# Patient Record
Sex: Female | Born: 1950 | Race: White | Hispanic: No | Marital: Married | Smoking: Never smoker
Health system: Southern US, Community
[De-identification: ages and names within clinical notes are randomized; demographics above are authoritative.]

## PROBLEM LIST (undated history)

## (undated) DIAGNOSIS — I5022 Chronic systolic (congestive) heart failure: Secondary | ICD-10-CM

## (undated) DIAGNOSIS — I420 Dilated cardiomyopathy: Secondary | ICD-10-CM

## (undated) DIAGNOSIS — K219 Gastro-esophageal reflux disease without esophagitis: Secondary | ICD-10-CM

## (undated) DIAGNOSIS — E1121 Type 2 diabetes mellitus with diabetic nephropathy: Secondary | ICD-10-CM

## (undated) DIAGNOSIS — K9589 Other complications of other bariatric procedure: Secondary | ICD-10-CM

## (undated) DIAGNOSIS — E119 Type 2 diabetes mellitus without complications: Secondary | ICD-10-CM

## (undated) DIAGNOSIS — I509 Heart failure, unspecified: Secondary | ICD-10-CM

## (undated) DIAGNOSIS — L718 Other rosacea: Secondary | ICD-10-CM

## (undated) DIAGNOSIS — K912 Postsurgical malabsorption, not elsewhere classified: Secondary | ICD-10-CM

## (undated) DIAGNOSIS — Z9884 Bariatric surgery status: Secondary | ICD-10-CM

## (undated) DIAGNOSIS — E785 Hyperlipidemia, unspecified: Secondary | ICD-10-CM

## (undated) DIAGNOSIS — I059 Rheumatic mitral valve disease, unspecified: Secondary | ICD-10-CM

## (undated) DIAGNOSIS — C801 Malignant (primary) neoplasm, unspecified: Secondary | ICD-10-CM

## (undated) DIAGNOSIS — I10 Essential (primary) hypertension: Secondary | ICD-10-CM

## (undated) DIAGNOSIS — D508 Other iron deficiency anemias: Secondary | ICD-10-CM

## (undated) DIAGNOSIS — D649 Anemia, unspecified: Secondary | ICD-10-CM

## (undated) HISTORY — PX: BREAST SURGERY: SHX581

## (undated) HISTORY — DX: Other rosacea: L71.8

## (undated) HISTORY — PX: TONSILLECTOMY: SUR1361

## (undated) HISTORY — DX: Gastro-esophageal reflux disease without esophagitis: K21.9

## (undated) HISTORY — DX: Heart failure, unspecified: I50.9

## (undated) HISTORY — DX: Rheumatic mitral valve disease, unspecified: I05.9

## (undated) HISTORY — PX: HERNIA REPAIR: SHX51

## (undated) HISTORY — DX: Type 2 diabetes mellitus without complications: E11.9

## (undated) HISTORY — PX: TUBAL LIGATION: SHX77

## (undated) HISTORY — PX: ROUX-EN-Y GASTRIC BYPASS: SHX1104

## (undated) HISTORY — PX: ABDOMINAL HYSTERECTOMY: SHX81

## (undated) HISTORY — DX: Essential (primary) hypertension: I10

## (undated) MED FILL — Iron Sucrose Inj 20 MG/ML (Fe Equiv): INTRAVENOUS | Qty: 10 | Status: AC

---

## 2010-06-01 ENCOUNTER — Ambulatory Visit: Payer: Self-pay | Admitting: Surgery

## 2010-06-08 ENCOUNTER — Ambulatory Visit: Payer: Self-pay | Admitting: Surgery

## 2010-06-10 LAB — PATHOLOGY REPORT

## 2010-06-23 ENCOUNTER — Ambulatory Visit: Payer: Self-pay | Admitting: Surgery

## 2010-06-25 LAB — PATHOLOGY REPORT

## 2010-07-06 ENCOUNTER — Ambulatory Visit: Payer: Self-pay | Admitting: Oncology

## 2010-07-19 ENCOUNTER — Ambulatory Visit: Payer: Self-pay | Admitting: Oncology

## 2010-08-19 ENCOUNTER — Ambulatory Visit: Payer: Self-pay | Admitting: Oncology

## 2010-09-17 ENCOUNTER — Ambulatory Visit: Payer: Self-pay | Admitting: Oncology

## 2010-10-18 ENCOUNTER — Ambulatory Visit: Payer: Self-pay | Admitting: Oncology

## 2010-11-17 ENCOUNTER — Ambulatory Visit: Payer: Self-pay | Admitting: Oncology

## 2010-12-18 ENCOUNTER — Ambulatory Visit: Payer: Self-pay | Admitting: Oncology

## 2011-01-17 ENCOUNTER — Ambulatory Visit: Payer: Self-pay | Admitting: Oncology

## 2011-02-18 ENCOUNTER — Ambulatory Visit: Payer: Self-pay | Admitting: Oncology

## 2011-02-19 LAB — CANCER ANTIGEN 27.29: CA 27.29: 24.1 U/mL (ref 0.0–38.6)

## 2011-03-20 ENCOUNTER — Ambulatory Visit: Payer: Self-pay | Admitting: Oncology

## 2011-04-19 ENCOUNTER — Ambulatory Visit: Payer: Self-pay | Admitting: Oncology

## 2011-05-20 ENCOUNTER — Ambulatory Visit: Payer: Self-pay | Admitting: Oncology

## 2011-07-29 ENCOUNTER — Ambulatory Visit: Payer: Self-pay | Admitting: Radiation Oncology

## 2011-08-20 ENCOUNTER — Ambulatory Visit: Payer: Self-pay | Admitting: Radiation Oncology

## 2011-09-09 LAB — CBC CANCER CENTER
Basophil #: 0.1 x10 3/mm (ref 0.0–0.1)
Eosinophil #: 0.5 x10 3/mm (ref 0.0–0.7)
Eosinophil %: 6.7 %
Lymphocyte #: 1.3 x10 3/mm (ref 1.0–3.6)
MCH: 28.3 pg (ref 26.0–34.0)
MCV: 84 fL (ref 80–100)
Monocyte #: 0.7 x10 3/mm (ref 0.0–0.7)
Neutrophil %: 65.2 %
Platelet: 290 x10 3/mm (ref 150–440)
RBC: 4.23 10*6/uL (ref 3.80–5.20)
RDW: 15.3 % — ABNORMAL HIGH (ref 11.5–14.5)

## 2011-09-09 LAB — COMPREHENSIVE METABOLIC PANEL
Alkaline Phosphatase: 154 U/L — ABNORMAL HIGH (ref 50–136)
Anion Gap: 7 (ref 7–16)
BUN: 15 mg/dL (ref 7–18)
Bilirubin,Total: 0.3 mg/dL (ref 0.2–1.0)
Co2: 29 mmol/L (ref 21–32)
Creatinine: 1.31 mg/dL — ABNORMAL HIGH (ref 0.60–1.30)
EGFR (Non-African Amer.): 44 — ABNORMAL LOW
Potassium: 4.2 mmol/L (ref 3.5–5.1)
SGOT(AST): 25 U/L (ref 15–37)
SGPT (ALT): 46 U/L
Total Protein: 7.8 g/dL (ref 6.4–8.2)

## 2011-09-17 ENCOUNTER — Ambulatory Visit: Payer: Self-pay | Admitting: Radiation Oncology

## 2012-05-11 ENCOUNTER — Ambulatory Visit: Payer: Self-pay | Admitting: Oncology

## 2012-05-11 LAB — CBC CANCER CENTER
Basophil #: 0.1 x10 3/mm (ref 0.0–0.1)
Basophil %: 1.2 %
Eosinophil #: 0.3 x10 3/mm (ref 0.0–0.7)
HCT: 37.1 % (ref 35.0–47.0)
HGB: 11.8 g/dL — ABNORMAL LOW (ref 12.0–16.0)
Lymphocyte #: 1.4 x10 3/mm (ref 1.0–3.6)
Lymphocyte %: 19 %
MCH: 27.2 pg (ref 26.0–34.0)
Monocyte %: 9.3 %
Neutrophil #: 4.8 x10 3/mm (ref 1.4–6.5)
RBC: 4.35 10*6/uL (ref 3.80–5.20)
RDW: 14.8 % — ABNORMAL HIGH (ref 11.5–14.5)
WBC: 7.3 x10 3/mm (ref 3.6–11.0)

## 2012-05-11 LAB — COMPREHENSIVE METABOLIC PANEL
Alkaline Phosphatase: 145 U/L — ABNORMAL HIGH (ref 50–136)
Anion Gap: 12 (ref 7–16)
Bilirubin,Total: 0.2 mg/dL (ref 0.2–1.0)
Calcium, Total: 9.4 mg/dL (ref 8.5–10.1)
Chloride: 101 mmol/L (ref 98–107)
Co2: 24 mmol/L (ref 21–32)
Creatinine: 1.23 mg/dL (ref 0.60–1.30)
EGFR (African American): 54 — ABNORMAL LOW
EGFR (Non-African Amer.): 47 — ABNORMAL LOW
SGOT(AST): 27 U/L (ref 15–37)
SGPT (ALT): 47 U/L (ref 12–78)
Sodium: 137 mmol/L (ref 136–145)

## 2012-05-12 LAB — CANCER ANTIGEN 27.29: CA 27.29: 24.7 U/mL (ref 0.0–38.6)

## 2012-05-19 ENCOUNTER — Ambulatory Visit: Payer: Self-pay | Admitting: Oncology

## 2012-07-16 IMAGING — US US OUTSIDE FILMS BREAST
1 series · 10 of 10 positions shown · non-contrast
Comparison: none

[Series 1: breast · 10 of 10 slices shown]
[im 1/10]
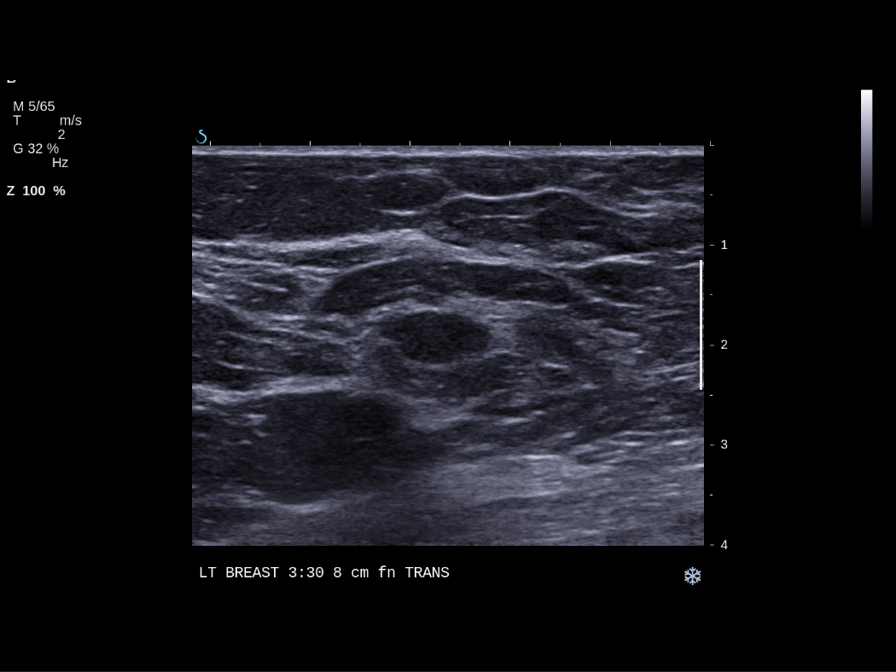
[im 2/10]
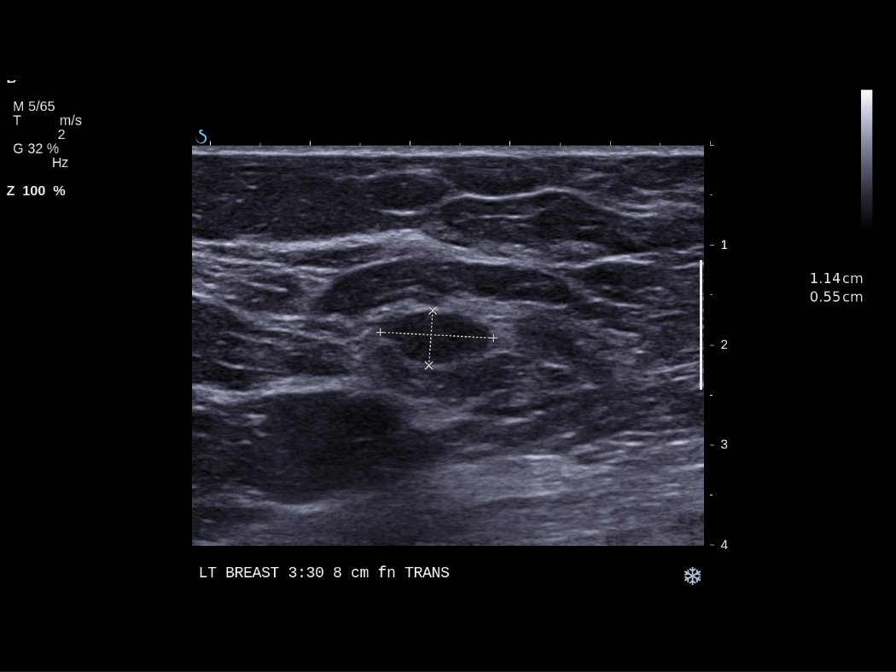
[im 3/10]
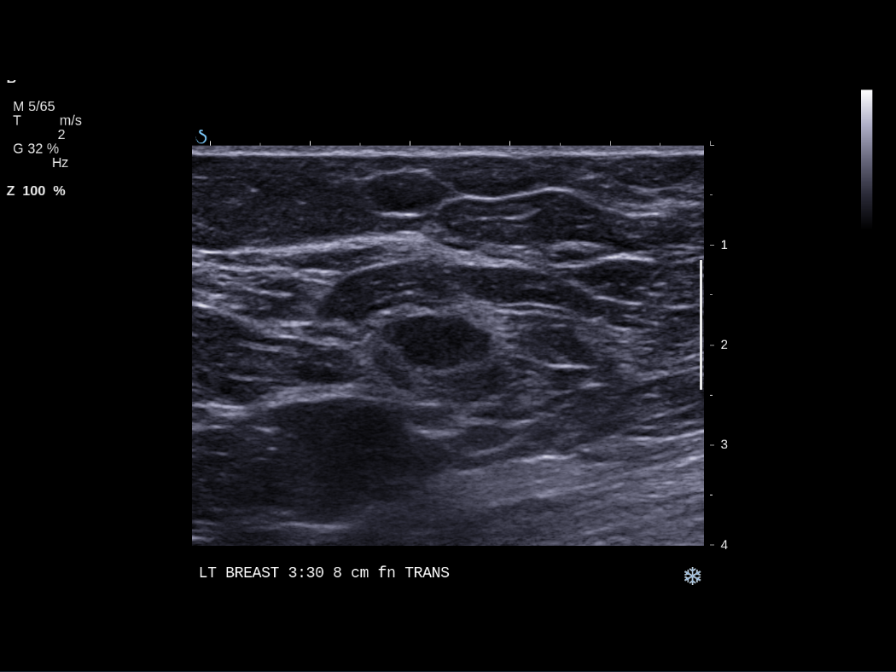
[im 4/10]
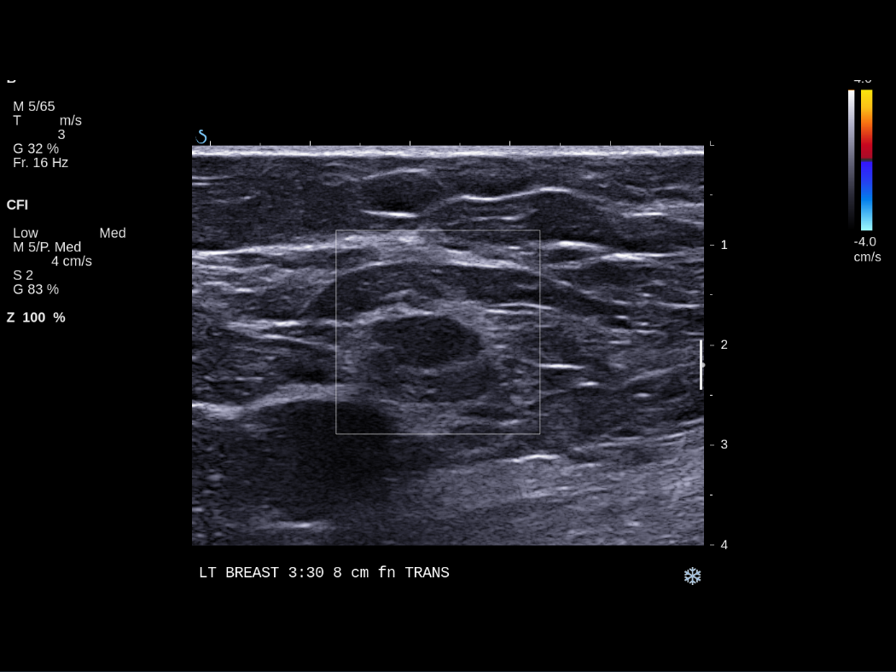
[im 5/10]
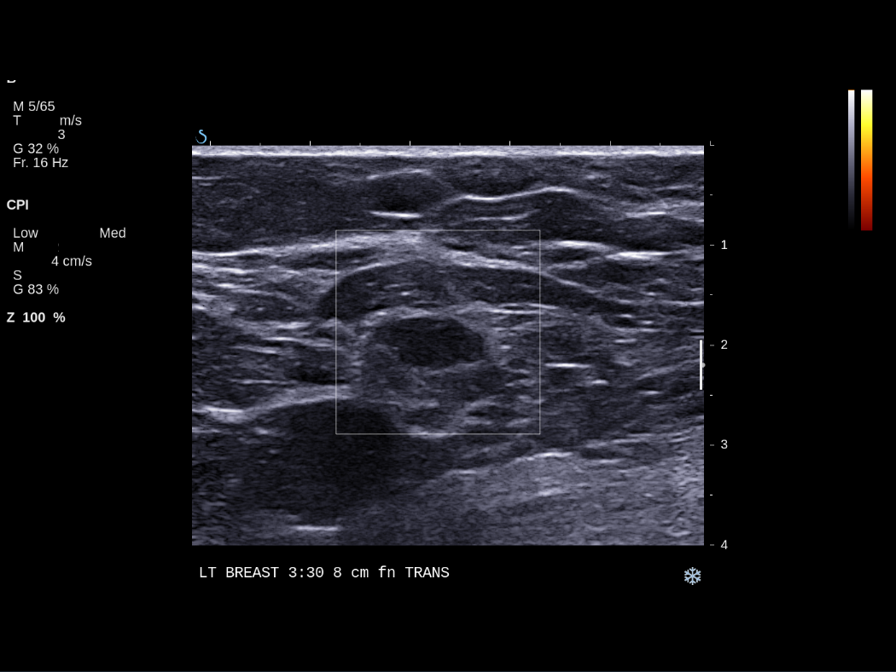
[im 6/10]
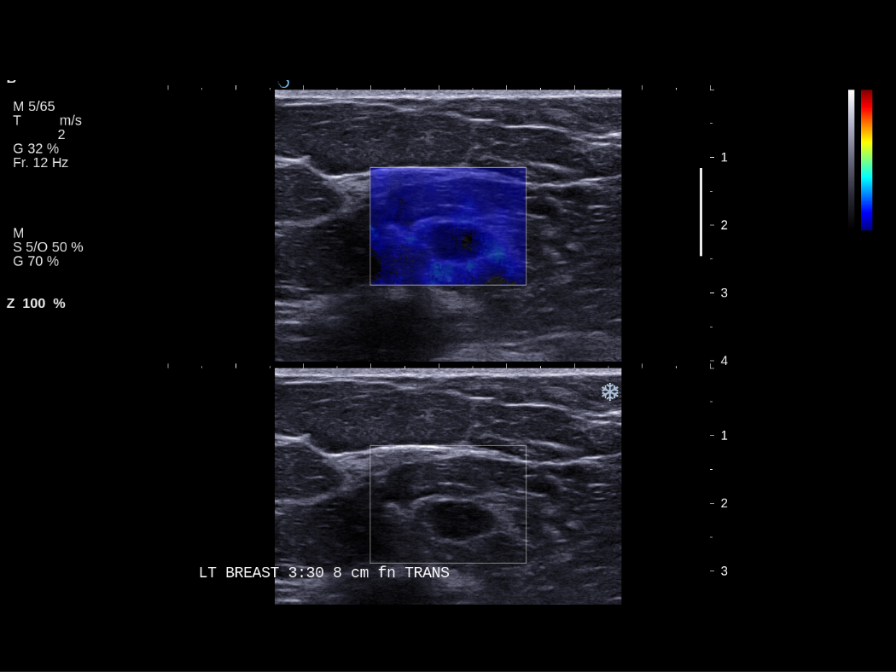
[im 7/10]
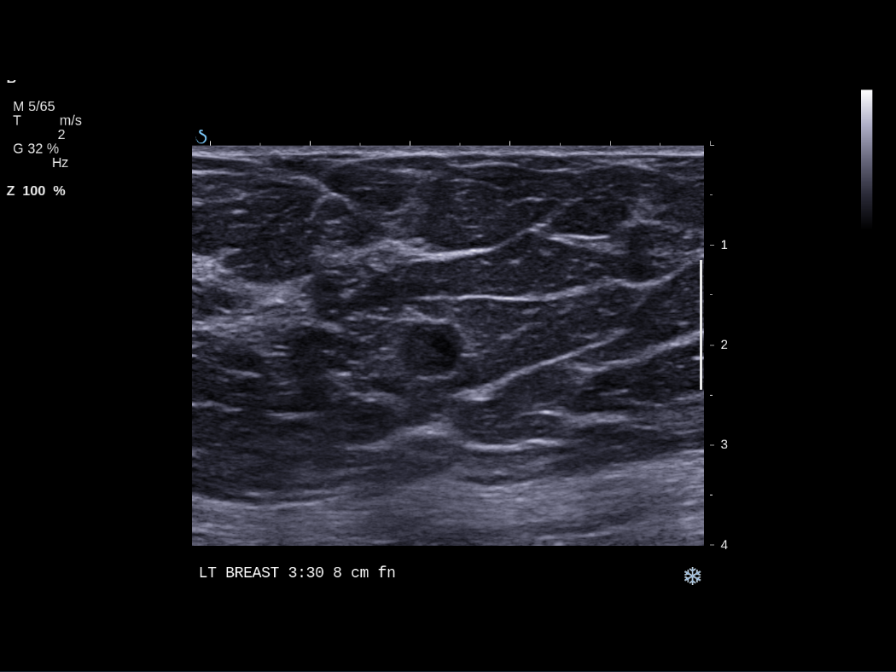
[im 8/10]
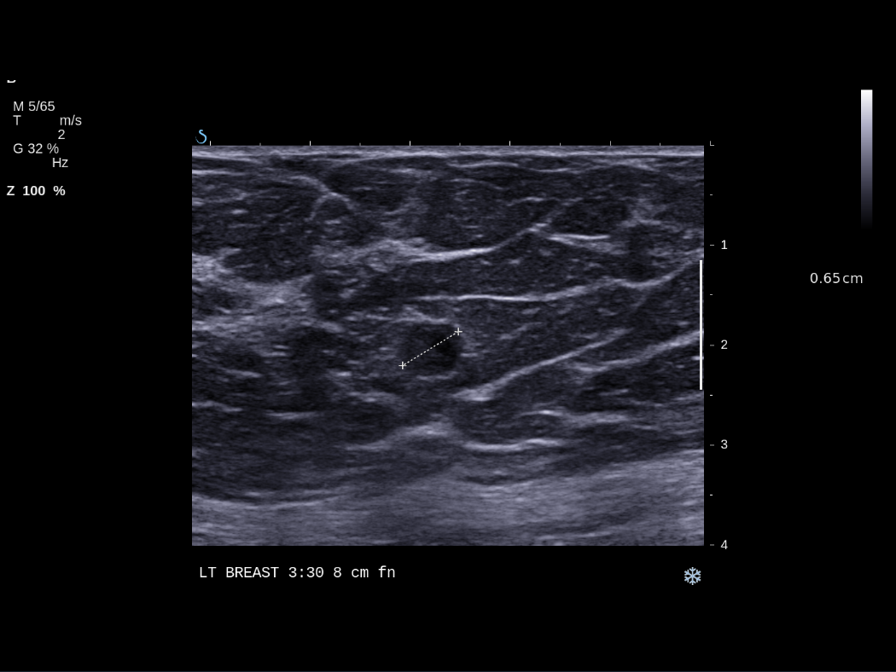
[im 9/10]
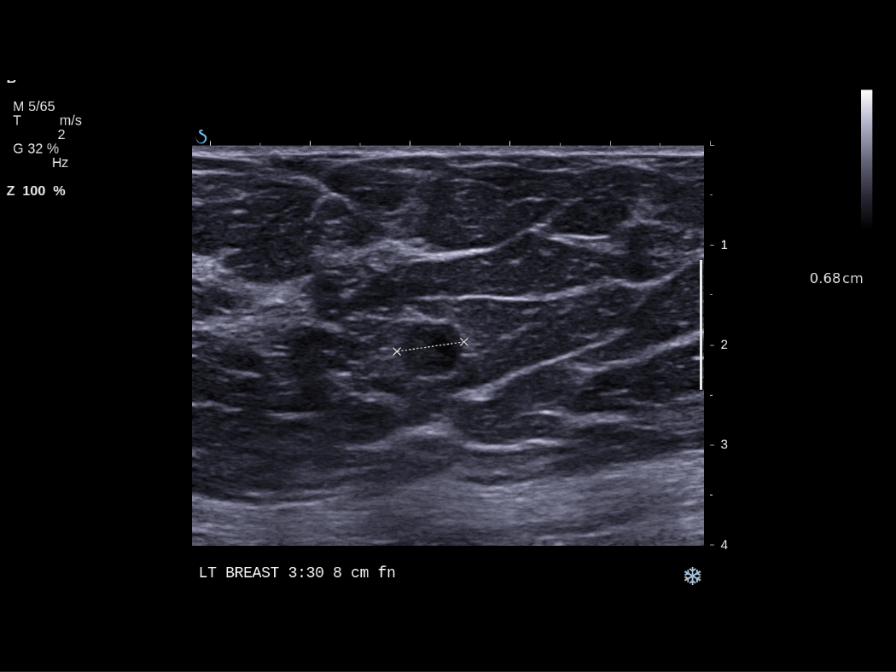
[im 10/10]
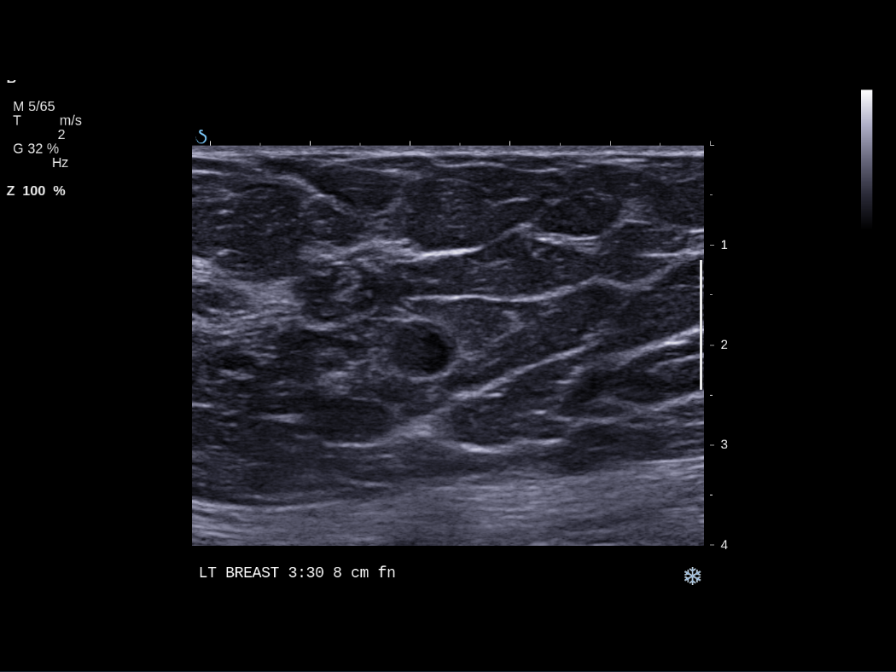

[10 of 10 positions shown; findings below may reference images not displayed]

IMAGES IMPORTED FROM THE SYNGO WORKFLOW SYSTEM
NO DICTATION FOR STUDY

## 2012-11-08 ENCOUNTER — Ambulatory Visit: Payer: Self-pay | Admitting: Oncology

## 2012-11-22 ENCOUNTER — Ambulatory Visit: Payer: Self-pay | Admitting: Oncology

## 2012-11-23 LAB — COMPREHENSIVE METABOLIC PANEL WITH GFR
Albumin: 3.7 g/dL
Alkaline Phosphatase: 133 U/L
Anion Gap: 10
BUN: 22 mg/dL — ABNORMAL HIGH
Bilirubin,Total: 0.3 mg/dL
Calcium, Total: 9.4 mg/dL
Chloride: 99 mmol/L
Co2: 26 mmol/L
Creatinine: 1.34 mg/dL — ABNORMAL HIGH
EGFR (African American): 49 — ABNORMAL LOW
EGFR (Non-African Amer.): 42 — ABNORMAL LOW
Glucose: 100 mg/dL — ABNORMAL HIGH
Osmolality: 274
Potassium: 3.9 mmol/L
SGOT(AST): 24 U/L
SGPT (ALT): 41 U/L
Sodium: 135 mmol/L — ABNORMAL LOW
Total Protein: 7.4 g/dL

## 2012-11-23 LAB — CBC CANCER CENTER
Basophil %: 0.8 %
Eosinophil #: 0.4 x10 3/mm (ref 0.0–0.7)
HCT: 35.7 % (ref 35.0–47.0)
Lymphocyte %: 19.6 %
MCH: 27.3 pg (ref 26.0–34.0)
MCHC: 33.2 g/dL (ref 32.0–36.0)
MCV: 82 fL (ref 80–100)
Monocyte #: 0.8 x10 3/mm (ref 0.2–0.9)
Monocyte %: 9.3 %
Neutrophil #: 5.8 x10 3/mm (ref 1.4–6.5)
Platelet: 273 x10 3/mm (ref 150–440)
RBC: 4.35 10*6/uL (ref 3.80–5.20)
WBC: 8.8 x10 3/mm (ref 3.6–11.0)

## 2012-12-17 ENCOUNTER — Ambulatory Visit: Payer: Self-pay | Admitting: Oncology

## 2013-06-19 ENCOUNTER — Ambulatory Visit: Payer: Self-pay | Admitting: Oncology

## 2013-06-19 LAB — CBC CANCER CENTER
Eosinophil #: 0.2 x10 3/mm (ref 0.0–0.7)
Eosinophil %: 1.6 %
HCT: 39.9 % (ref 35.0–47.0)
HGB: 13.1 g/dL (ref 12.0–16.0)
Lymphocyte %: 11.7 %
MCH: 27.4 pg (ref 26.0–34.0)
MCHC: 32.9 g/dL (ref 32.0–36.0)
Monocyte #: 0.5 x10 3/mm (ref 0.2–0.9)
Neutrophil #: 7.6 x10 3/mm — ABNORMAL HIGH (ref 1.4–6.5)
Neutrophil %: 80.2 %
Platelet: 334 x10 3/mm (ref 150–440)
RBC: 4.79 10*6/uL (ref 3.80–5.20)
RDW: 14.9 % — ABNORMAL HIGH (ref 11.5–14.5)
WBC: 9.5 x10 3/mm (ref 3.6–11.0)

## 2013-06-19 LAB — COMPREHENSIVE METABOLIC PANEL
Albumin: 4.1 g/dL (ref 3.4–5.0)
Alkaline Phosphatase: 133 U/L — ABNORMAL HIGH
BUN: 16 mg/dL (ref 7–18)
Bilirubin,Total: 0.4 mg/dL (ref 0.2–1.0)
Calcium, Total: 9.9 mg/dL (ref 8.5–10.1)
Chloride: 95 mmol/L — ABNORMAL LOW (ref 98–107)
Co2: 28 mmol/L (ref 21–32)
Creatinine: 1.32 mg/dL — ABNORMAL HIGH (ref 0.60–1.30)
EGFR (African American): 50 — ABNORMAL LOW
EGFR (Non-African Amer.): 43 — ABNORMAL LOW
Osmolality: 267 (ref 275–301)
Potassium: 4.6 mmol/L (ref 3.5–5.1)
SGOT(AST): 42 U/L — ABNORMAL HIGH (ref 15–37)
Sodium: 132 mmol/L — ABNORMAL LOW (ref 136–145)
Total Protein: 8.1 g/dL (ref 6.4–8.2)

## 2013-06-20 LAB — CANCER ANTIGEN 27.29: CA 27.29: 21.7 U/mL (ref 0.0–38.6)

## 2013-07-19 ENCOUNTER — Ambulatory Visit: Payer: Self-pay | Admitting: Oncology

## 2013-10-08 DIAGNOSIS — E119 Type 2 diabetes mellitus without complications: Secondary | ICD-10-CM | POA: Insufficient documentation

## 2013-10-08 DIAGNOSIS — K219 Gastro-esophageal reflux disease without esophagitis: Secondary | ICD-10-CM | POA: Insufficient documentation

## 2013-10-08 DIAGNOSIS — I10 Essential (primary) hypertension: Secondary | ICD-10-CM | POA: Insufficient documentation

## 2013-10-08 DIAGNOSIS — I509 Heart failure, unspecified: Secondary | ICD-10-CM | POA: Insufficient documentation

## 2013-12-12 DIAGNOSIS — C50919 Malignant neoplasm of unspecified site of unspecified female breast: Secondary | ICD-10-CM | POA: Insufficient documentation

## 2014-01-25 ENCOUNTER — Ambulatory Visit: Payer: Self-pay | Admitting: Oncology

## 2014-01-25 LAB — CBC CANCER CENTER
Basophil #: 0.1 x10 3/mm (ref 0.0–0.1)
Basophil %: 0.9 %
Eosinophil #: 0.3 x10 3/mm (ref 0.0–0.7)
Eosinophil %: 3.1 %
HCT: 39.3 % (ref 35.0–47.0)
HGB: 12.9 g/dL (ref 12.0–16.0)
Lymphocyte #: 1.4 x10 3/mm (ref 1.0–3.6)
Lymphocyte %: 16.8 %
MCH: 27.6 pg (ref 26.0–34.0)
MCHC: 32.8 g/dL (ref 32.0–36.0)
MCV: 84 fL (ref 80–100)
Monocyte #: 0.6 x10 3/mm (ref 0.2–0.9)
Monocyte %: 7.3 %
Neutrophil #: 6 x10 3/mm (ref 1.4–6.5)
Neutrophil %: 71.9 %
Platelet: 324 x10 3/mm (ref 150–440)
RBC: 4.68 10*6/uL (ref 3.80–5.20)
RDW: 15.1 % — ABNORMAL HIGH (ref 11.5–14.5)
WBC: 8.3 x10 3/mm (ref 3.6–11.0)

## 2014-01-25 LAB — COMPREHENSIVE METABOLIC PANEL
Albumin: 3.8 g/dL (ref 3.4–5.0)
Alkaline Phosphatase: 125 U/L — ABNORMAL HIGH
Anion Gap: 12 (ref 7–16)
BUN: 25 mg/dL — ABNORMAL HIGH (ref 7–18)
Bilirubin,Total: 0.3 mg/dL (ref 0.2–1.0)
Calcium, Total: 10.1 mg/dL (ref 8.5–10.1)
Chloride: 98 mmol/L (ref 98–107)
Co2: 28 mmol/L (ref 21–32)
Creatinine: 1.25 mg/dL (ref 0.60–1.30)
EGFR (African American): 53 — ABNORMAL LOW
EGFR (Non-African Amer.): 45 — ABNORMAL LOW
Glucose: 127 mg/dL — ABNORMAL HIGH (ref 65–99)
Osmolality: 282 (ref 275–301)
Potassium: 4.4 mmol/L (ref 3.5–5.1)
SGOT(AST): 22 U/L (ref 15–37)
SGPT (ALT): 37 U/L (ref 12–78)
Sodium: 138 mmol/L (ref 136–145)
Total Protein: 7.9 g/dL (ref 6.4–8.2)

## 2014-02-16 ENCOUNTER — Ambulatory Visit: Payer: Self-pay | Admitting: Oncology

## 2014-05-07 DIAGNOSIS — R0681 Apnea, not elsewhere classified: Secondary | ICD-10-CM | POA: Insufficient documentation

## 2014-05-07 DIAGNOSIS — E782 Mixed hyperlipidemia: Secondary | ICD-10-CM | POA: Insufficient documentation

## 2014-05-29 DIAGNOSIS — I340 Nonrheumatic mitral (valve) insufficiency: Secondary | ICD-10-CM | POA: Insufficient documentation

## 2014-07-26 ENCOUNTER — Ambulatory Visit: Payer: Self-pay | Admitting: Oncology

## 2014-07-26 LAB — COMPREHENSIVE METABOLIC PANEL
ANION GAP: 8 (ref 7–16)
Albumin: 3.4 g/dL (ref 3.4–5.0)
Alkaline Phosphatase: 104 U/L
BUN: 14 mg/dL (ref 7–18)
Bilirubin,Total: 0.3 mg/dL (ref 0.2–1.0)
CALCIUM: 9.6 mg/dL (ref 8.5–10.1)
Chloride: 97 mmol/L — ABNORMAL LOW (ref 98–107)
Co2: 29 mmol/L (ref 21–32)
Creatinine: 1.14 mg/dL (ref 0.60–1.30)
EGFR (African American): >60
EGFR (Non-African Amer.): 51 — ABNORMAL LOW
Glucose: 107 mg/dL — ABNORMAL HIGH (ref 65–99)
Osmolality: 269 (ref 275–301)
Potassium: 4.7 mmol/L (ref 3.5–5.1)
SGOT(AST): 28 U/L (ref 15–37)
SGPT (ALT): 41 U/L
Sodium: 134 mmol/L — ABNORMAL LOW (ref 136–145)
TOTAL PROTEIN: 7.5 g/dL (ref 6.4–8.2)

## 2014-07-26 LAB — CBC CANCER CENTER
BASOS ABS: 0.1 x10 3/mm (ref 0.0–0.1)
BASOS PCT: 0.7 %
EOS ABS: 0.2 x10 3/mm (ref 0.0–0.7)
Eosinophil %: 2.5 %
HCT: 37.3 % (ref 35.0–47.0)
HGB: 12.1 g/dL (ref 12.0–16.0)
LYMPHS PCT: 11.9 %
Lymphocyte #: 1.1 x10 3/mm (ref 1.0–3.6)
MCH: 27.3 pg (ref 26.0–34.0)
MCHC: 32.3 g/dL (ref 32.0–36.0)
MCV: 85 fL (ref 80–100)
MONO ABS: 0.8 x10 3/mm (ref 0.2–0.9)
Monocyte %: 8.2 %
NEUTROS PCT: 76.7 %
Neutrophil #: 7.2 x10 3/mm — ABNORMAL HIGH (ref 1.4–6.5)
PLATELETS: 373 x10 3/mm (ref 150–440)
RBC: 4.42 10*6/uL (ref 3.80–5.20)
RDW: 14.6 % — ABNORMAL HIGH (ref 11.5–14.5)
WBC: 9.4 x10 3/mm (ref 3.6–11.0)

## 2014-07-29 DIAGNOSIS — Z9889 Other specified postprocedural states: Secondary | ICD-10-CM | POA: Insufficient documentation

## 2014-07-29 DIAGNOSIS — Z713 Dietary counseling and surveillance: Secondary | ICD-10-CM | POA: Insufficient documentation

## 2014-07-29 DIAGNOSIS — E669 Obesity, unspecified: Secondary | ICD-10-CM | POA: Insufficient documentation

## 2014-08-19 ENCOUNTER — Ambulatory Visit: Payer: Self-pay | Admitting: Oncology

## 2014-12-02 ENCOUNTER — Encounter: Payer: Self-pay | Admitting: Oncology

## 2014-12-13 ENCOUNTER — Other Ambulatory Visit: Payer: Self-pay | Admitting: *Deleted

## 2014-12-13 DIAGNOSIS — C50919 Malignant neoplasm of unspecified site of unspecified female breast: Secondary | ICD-10-CM

## 2014-12-19 ENCOUNTER — Other Ambulatory Visit: Payer: Self-pay

## 2014-12-19 ENCOUNTER — Ambulatory Visit: Payer: Self-pay | Admitting: Oncology

## 2015-01-09 ENCOUNTER — Inpatient Hospital Stay: Payer: Medicare Other | Attending: Oncology

## 2015-01-09 ENCOUNTER — Inpatient Hospital Stay (HOSPITAL_BASED_OUTPATIENT_CLINIC_OR_DEPARTMENT_OTHER): Payer: Medicare Other | Admitting: Oncology

## 2015-01-09 ENCOUNTER — Encounter (INDEPENDENT_AMBULATORY_CARE_PROVIDER_SITE_OTHER): Payer: Self-pay

## 2015-01-09 VITALS — BP 135/88 | HR 78 | Temp 96.9°F | Wt 167.5 lb

## 2015-01-09 DIAGNOSIS — Z79899 Other long term (current) drug therapy: Secondary | ICD-10-CM | POA: Diagnosis not present

## 2015-01-09 DIAGNOSIS — Z9012 Acquired absence of left breast and nipple: Secondary | ICD-10-CM | POA: Diagnosis not present

## 2015-01-09 DIAGNOSIS — E119 Type 2 diabetes mellitus without complications: Secondary | ICD-10-CM | POA: Diagnosis not present

## 2015-01-09 DIAGNOSIS — Z90710 Acquired absence of both cervix and uterus: Secondary | ICD-10-CM | POA: Diagnosis not present

## 2015-01-09 DIAGNOSIS — Z9884 Bariatric surgery status: Secondary | ICD-10-CM | POA: Insufficient documentation

## 2015-01-09 DIAGNOSIS — K219 Gastro-esophageal reflux disease without esophagitis: Secondary | ICD-10-CM

## 2015-01-09 DIAGNOSIS — C50912 Malignant neoplasm of unspecified site of left female breast: Secondary | ICD-10-CM

## 2015-01-09 DIAGNOSIS — I10 Essential (primary) hypertension: Secondary | ICD-10-CM | POA: Insufficient documentation

## 2015-01-09 DIAGNOSIS — Z800 Family history of malignant neoplasm of digestive organs: Secondary | ICD-10-CM | POA: Insufficient documentation

## 2015-01-09 DIAGNOSIS — Z79811 Long term (current) use of aromatase inhibitors: Secondary | ICD-10-CM | POA: Diagnosis not present

## 2015-01-09 DIAGNOSIS — E871 Hypo-osmolality and hyponatremia: Secondary | ICD-10-CM | POA: Diagnosis not present

## 2015-01-09 DIAGNOSIS — Z170 Estrogen receptor positive status [ER+]: Secondary | ICD-10-CM | POA: Diagnosis not present

## 2015-01-09 DIAGNOSIS — C50919 Malignant neoplasm of unspecified site of unspecified female breast: Secondary | ICD-10-CM

## 2015-01-09 LAB — COMPREHENSIVE METABOLIC PANEL
ALK PHOS: 99 U/L (ref 38–126)
ALT: 33 U/L (ref 14–54)
AST: 50 U/L — AB (ref 15–41)
Albumin: 4.2 g/dL (ref 3.5–5.0)
Anion gap: 10 (ref 5–15)
BILIRUBIN TOTAL: 1 mg/dL (ref 0.3–1.2)
BUN: 10 mg/dL (ref 6–20)
CHLORIDE: 90 mmol/L — AB (ref 101–111)
CO2: 26 mmol/L (ref 22–32)
Calcium: 8.9 mg/dL (ref 8.9–10.3)
Creatinine, Ser: 1.03 mg/dL — ABNORMAL HIGH (ref 0.44–1.00)
GFR calc Af Amer: >60 mL/min (ref >60)
GFR, EST NON AFRICAN AMERICAN: 56 mL/min — AB (ref >60)
Glucose, Bld: 128 mg/dL — ABNORMAL HIGH (ref 65–99)
Potassium: 4.5 mmol/L (ref 3.5–5.1)
SODIUM: 126 mmol/L — AB (ref 135–145)
Total Protein: 7.1 g/dL (ref 6.5–8.1)

## 2015-01-09 LAB — CBC WITH DIFFERENTIAL/PLATELET
Basophils Absolute: 0 10*3/uL (ref 0–0.1)
Basophils Relative: 1 %
EOS ABS: 0.1 10*3/uL (ref 0–0.7)
EOS PCT: 1 %
HCT: 38.3 % (ref 35.0–47.0)
Hemoglobin: 12.7 g/dL (ref 12.0–16.0)
LYMPHS PCT: 14 %
Lymphs Abs: 1.1 10*3/uL (ref 1.0–3.6)
MCH: 28.7 pg (ref 26.0–34.0)
MCHC: 33.2 g/dL (ref 32.0–36.0)
MCV: 86.5 fL (ref 80.0–100.0)
Monocytes Absolute: 0.8 10*3/uL (ref 0.2–0.9)
Monocytes Relative: 10 %
Neutro Abs: 6.1 10*3/uL (ref 1.4–6.5)
Neutrophils Relative %: 74 %
Platelets: 288 10*3/uL (ref 150–440)
RBC: 4.43 MIL/uL (ref 3.80–5.20)
RDW: 14.7 % — ABNORMAL HIGH (ref 11.5–14.5)
WBC: 8.2 10*3/uL (ref 3.6–11.0)

## 2015-01-09 MED ORDER — ANASTROZOLE 1 MG PO TABS: 1.0000 mg | ORAL_TABLET | Freq: Every day | ORAL | Status: DC

## 2015-01-09 NOTE — Progress Notes (Signed)
Patient does not have living will.  Never smoked. 

## 2015-01-20 ENCOUNTER — Encounter: Payer: Self-pay | Admitting: Oncology

## 2015-01-20 NOTE — Progress Notes (Signed)
[place] @ [facility] Telephone:(336) [phone]  Fax:(336) [place]: [birth year]  MR#: [id number]  RSW#:[id number]  Patient Care Team: Chrisandra Carota, MD as PCP - General (Family Medicine)  CHIEF COMPLAINT:  Chief Complaint  Patient presents with  . Follow-up    Oncology History   Chief Complaint/Diagnosis:   1. 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 2. 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  Patient had a recent mammogram done at outside institution.  Had abnormal mammograms biopsy was done which was reported to be negative.  Patient is continuing anti-hormonal therapy without any significant problem.  Patient also had a bone density study done at outside institution REVIEW OF SYSTEMS:   GENERAL:  Feels good.  Active.  No fevers, sweats or weight loss. PERFORMANCE STATUS (ECOG):  01 HEENT:  No visual changes, runny nose, sore throat, mouth sores or tenderness. Lungs: No shortness of breath or cough.  No hemoptysis. Cardiac:  No chest pain, palpitations, orthopnea, or PND. GI:  No nausea, vomiting, diarrhea, constipation, melena or hematochezia. GU:  No urgency, frequency, dysuria, or hematuria. Musculoskeletal:  No back pain.  No joint pain.  No muscle tenderness. Extremities:  No pain or swelling. Skin:  No rashes or skin changes. Neuro:  No headache, numbness or weakness, balance or coordination issues. Endocrine:  No diabetes, thyroid issues, hot flashes or night sweats. Psych:  No mood changes, depression or anxiety. Pain:  No focal pain. Review of systems:  All other systems reviewed and found to be negative. As per HPI. Otherwise, a complete review of systems is negatve.    Allergies:  Animal dander-Cat: Wheezing  Significant History/PMH:    Mitral Valve Disorder:    Gastric Reflux:    Breast Cancer:    CHF      2001:    HTN:    diabetic:    Left radical mastectomy: Nov 2011   tonsillectomy:    hysterectomy:  Bariatric surgery with gastric bypass  Preventive Screening:  Has patient had any of the following test? Mammography (1)   Last Mammography: May 2016 @ [facility](1)   Smoking History: Smoking History Never Smoked.(1)  PFSH: Comments: Family history of colon cancer and stomach cancer in mother,No family history of  breast cancer, or ovarian cancer.   History of diabetes, heart disease and hypertension in the family  Comments: Does not smoke.  Social drinker.  Retired Engineer, building services Past Medical and Surgical History: Hysterectomy and oophorectomy.  Congestive heart failure, diagnosed 11 years ago.  Hypertensin.  Diabetes.  Moderate obesity   ADVANCED DIRECTIVES:  Patient does not have living will.  Information was provided  HEALTH MAINTENANCE: History  Substance Use Topics  . Smoking status: Passive Smoke Exposure - Never Smoker  . Smokeless tobacco: Not on file  . Alcohol Use: Not on file      Allergies  Allergen Reactions  . Rosuvastatin     Other reaction(s): Muscle Pain    Current Outpatient Prescriptions  Medication Sig Dispense Refill  . carvedilol (COREG) 12.5 MG tablet TAKE 1 TABLET BY MOUTH TWICE A DAY    . esomeprazole (NEXIUM 24HR) 20 MG capsule TAKE 1 CAPSULE (40 MG TOTAL) BY MOUTH ONCE DAILY. OTC NEXIUM    .  ezetimibe (ZETIA) 10 MG tablet Take by mouth.    . furosemide (LASIX) 40 MG tablet Take by mouth.    . metFORMIN (GLUCOPHAGE) 500 MG tablet Take by mouth.    . ramipril (ALTACE) 10 MG capsule     . spironolactone (ALDACTONE) 25 MG tablet Take by mouth.    Marland Kitchen anastrozole (ARIMIDEX) 1 MG tablet Take 1 tablet (1 mg total) by mouth daily. 30 tablet 6  . cyanocobalamin ([employer]S VITAMIN B12) 2000 MCG tablet Take by mouth.    . Flaxseed MISC Take by mouth.    .  magnesium oxide (MAG-OX) 400 MG tablet Take by mouth.    . Omega-3 Fatty Acids (FISH OIL) 1000 MG CAPS Take by mouth.    . pantoprazole (PROTONIX) 40 MG tablet     . saccharomyces boulardii (FLORASTOR) 250 MG capsule Take by mouth.    . Vitamin D, Cholecalciferol, 400 UNITS CAPS Take by mouth.     No current facility-administered medications for this visit.    OBJECTIVE:  Filed Vitals:   01/09/15 0945  BP: 135/88  Pulse: 78  Temp: 96.9 F (36.1 C)     There is no height on file to calculate BMI.    ECOG FS:0 - Asymptomatic  PHYSICAL EXAM: GENERAL:  Well developed, well nourished, sitting comfortably in the exam room in no acute distress. Patient is moderately obese MENTAL STATUS:  Alert and oriented to person, place and time. HEAD: .  Normocephalic, atraumatic, face symmetric, no Cushingoid features. EYES:  .  Pupils equal round and reactive to light and accomodation.  No conjunctivitis or scleral icterus. ENT:  Oropharynx clear without lesion.  Tongue normal. Mucous membranes moist.  RESPIRATORY:  Clear to auscultation without rales, wheezes or rhonchi. CARDIOVASCULAR:  Regular rate and rhythm without murmur, rub or gallop. BREAST:  Right breast without masses, skin changes or nipple discharge.  Left breast without masses, skin changes or nipple discharge. ABDOMEN:  Soft, non-tender, with active bowel sounds, and no hepatosplenomegaly.  No masses.  Patient recently had bariatric surgery BACK:  No CVA tenderness.  No tenderness on percussion of the back or rib cage. SKIN:  No rashes, ulcers or lesions. EXTREMITIES: No edema, no skin discoloration or tenderness.  No palpable cords. LYMPH NODES: No palpable cervical, supraclavicular, axillary or inguinal adenopathy  NEUROLOGICAL: Unremarkable. PSYCH:  Appropriate.   LAB RESULTS:  Appointment on 01/09/2015  Component Date Value Ref Range Status  . WBC 01/09/2015 8.2  3.6 - 11.0 K/uL Final  . RBC 01/09/2015 4.43  3.80 - 5.20  MIL/uL Final  . Hemoglobin 01/09/2015 12.7  12.0 - 16.0 g/dL Final  . HCT 01/09/2015 38.3  35.0 - 47.0 % Final  . MCV 01/09/2015 86.5  80.0 - 100.0 fL Final  . MCH 01/09/2015 28.7  26.0 - 34.0 pg Final  . MCHC 01/09/2015 33.2  32.0 - 36.0 g/dL Final  . RDW 01/09/2015 14.7* 11.5 - 14.5 % Final  . Platelets 01/09/2015 288  150 - 440 K/uL Final  . Neutrophils Relative % 01/09/2015 74   Final  . Neutro Abs 01/09/2015 6.1  1.4 - 6.5 K/uL Final  . Lymphocytes Relative 01/09/2015 14   Final  . Lymphs Abs 01/09/2015 1.1  1.0 - 3.6 K/uL Final  . Monocytes Relative 01/09/2015 10   Final  . Monocytes Absolute 01/09/2015 0.8  0.2 - 0.9 K/uL Final  . Eosinophils Relative 01/09/2015 1   Final  . Eosinophils Absolute 01/09/2015 0.1  0 -  0.7 K/uL Final  . Basophils Relative 01/09/2015 1   Final  . Basophils Absolute 01/09/2015 0.0  0 - 0.1 K/uL Final  . Sodium 01/09/2015 126* 135 - 145 mmol/L Final  . Potassium 01/09/2015 4.5  3.5 - 5.1 mmol/L Final  . Chloride 01/09/2015 90* 101 - 111 mmol/L Final  . CO2 01/09/2015 26  22 - 32 mmol/L Final  . Glucose, Bld 01/09/2015 128* 65 - 99 mg/dL Final  . BUN 01/09/2015 10  6 - 20 mg/dL Final  . Creatinine, Ser 01/09/2015 1.03* 0.44 - 1.00 mg/dL Final  . Calcium 01/09/2015 8.9  8.9 - 10.3 mg/dL Final  . Total Protein 01/09/2015 7.1  6.5 - 8.1 g/dL Final  . Albumin 01/09/2015 4.2  3.5 - 5.0 g/dL Final  . AST 01/09/2015 50* 15 - 41 U/L Final  . ALT 01/09/2015 33  14 - 54 U/L Final  . Alkaline Phosphatase 01/09/2015 99  38 - 126 U/L Final  . Total Bilirubin 01/09/2015 1.0  0.3 - 1.2 mg/dL Final  . GFR calc non Af Amer 01/09/2015 56* >60 mL/min Final  . GFR calc Af Amer 01/09/2015 >60  >60 mL/min Final   Comment: (NOTE) The eGFR has been calculated using the CKD EPI equation. This calculation has not been validated in all clinical situations. eGFR's persistently <60 mL/min signify possible Chronic Kidney Disease.   . Anion gap 01/09/2015 10  5 - 15 Final      STUDIES: No results found.  ASSESSMENT: Carcinoma of left breast stage I disease on   anastrozole.  Will continue antibiotic hormone therapy  MEDICAL DECISION MAKING:  All lab data was reviewed. Hyponatremia may be related to pediatric surgery Patient had a recent abnormal mammogram.  Biopsy report at [facility] was negative for any malignancy Patient reports muscle pain secondary to statin  Patient expressed understanding and was in agreement with this plan. She also understands that She can call clinic at any time with any questions, concerns, or complaints.    No matching staging information was found for the patient.  Forest Gleason, MD   01/20/2015 4:56 PM

## 2015-07-08 DIAGNOSIS — K912 Postsurgical malabsorption, not elsewhere classified: Secondary | ICD-10-CM | POA: Insufficient documentation

## 2015-07-24 ENCOUNTER — Inpatient Hospital Stay: Payer: Medicare Other | Attending: Oncology

## 2015-07-24 ENCOUNTER — Inpatient Hospital Stay (HOSPITAL_BASED_OUTPATIENT_CLINIC_OR_DEPARTMENT_OTHER): Payer: Medicare Other | Admitting: Oncology

## 2015-07-24 VITALS — BP 114/75 | HR 63 | Temp 97.3°F | Resp 18 | Wt 145.7 lb

## 2015-07-24 DIAGNOSIS — C50912 Malignant neoplasm of unspecified site of left female breast: Secondary | ICD-10-CM

## 2015-07-24 DIAGNOSIS — Z79899 Other long term (current) drug therapy: Secondary | ICD-10-CM | POA: Diagnosis not present

## 2015-07-24 DIAGNOSIS — I509 Heart failure, unspecified: Secondary | ICD-10-CM | POA: Diagnosis not present

## 2015-07-24 DIAGNOSIS — Z7984 Long term (current) use of oral hypoglycemic drugs: Secondary | ICD-10-CM | POA: Insufficient documentation

## 2015-07-24 DIAGNOSIS — K219 Gastro-esophageal reflux disease without esophagitis: Secondary | ICD-10-CM

## 2015-07-24 DIAGNOSIS — I10 Essential (primary) hypertension: Secondary | ICD-10-CM | POA: Diagnosis not present

## 2015-07-24 DIAGNOSIS — Z170 Estrogen receptor positive status [ER+]: Secondary | ICD-10-CM | POA: Diagnosis not present

## 2015-07-24 DIAGNOSIS — Z923 Personal history of irradiation: Secondary | ICD-10-CM

## 2015-07-24 DIAGNOSIS — Z800 Family history of malignant neoplasm of digestive organs: Secondary | ICD-10-CM | POA: Insufficient documentation

## 2015-07-24 DIAGNOSIS — C50919 Malignant neoplasm of unspecified site of unspecified female breast: Secondary | ICD-10-CM

## 2015-07-24 DIAGNOSIS — E119 Type 2 diabetes mellitus without complications: Secondary | ICD-10-CM

## 2015-07-24 DIAGNOSIS — C50512 Malignant neoplasm of lower-outer quadrant of left female breast: Secondary | ICD-10-CM

## 2015-07-24 LAB — CBC WITH DIFFERENTIAL/PLATELET
BASOS PCT: 1 %
Basophils Absolute: 0 10*3/uL (ref 0–0.1)
Eosinophils Absolute: 0.1 10*3/uL (ref 0–0.7)
Eosinophils Relative: 3 %
HEMATOCRIT: 36.2 % (ref 35.0–47.0)
HEMOGLOBIN: 12.1 g/dL (ref 12.0–16.0)
LYMPHS ABS: 1.2 10*3/uL (ref 1.0–3.6)
LYMPHS PCT: 23 %
MCH: 29.2 pg (ref 26.0–34.0)
MCHC: 33.4 g/dL (ref 32.0–36.0)
MCV: 87.4 fL (ref 80.0–100.0)
MONO ABS: 0.5 10*3/uL (ref 0.2–0.9)
Monocytes Relative: 10 %
NEUTROS ABS: 3.4 10*3/uL (ref 1.4–6.5)
NEUTROS PCT: 65 %
Platelets: 291 10*3/uL (ref 150–440)
RBC: 4.14 MIL/uL (ref 3.80–5.20)
RDW: 14.2 % (ref 11.5–14.5)
WBC: 5.2 10*3/uL (ref 3.6–11.0)

## 2015-07-24 LAB — COMPREHENSIVE METABOLIC PANEL
ALK PHOS: 83 U/L (ref 38–126)
ALT: 18 U/L (ref 14–54)
AST: 21 U/L (ref 15–41)
Albumin: 4.1 g/dL (ref 3.5–5.0)
Anion gap: 8 (ref 5–15)
BILIRUBIN TOTAL: 0.6 mg/dL (ref 0.3–1.2)
BUN: 16 mg/dL (ref 6–20)
CALCIUM: 9.3 mg/dL (ref 8.9–10.3)
CO2: 25 mmol/L (ref 22–32)
Chloride: 96 mmol/L — ABNORMAL LOW (ref 101–111)
Creatinine, Ser: 1.09 mg/dL — ABNORMAL HIGH (ref 0.44–1.00)
GFR calc non Af Amer: 52 mL/min — ABNORMAL LOW (ref >60)
Glucose, Bld: 107 mg/dL — ABNORMAL HIGH (ref 65–99)
POTASSIUM: 4.3 mmol/L (ref 3.5–5.1)
Sodium: 129 mmol/L — ABNORMAL LOW (ref 135–145)
Total Protein: 6.7 g/dL (ref 6.5–8.1)

## 2015-07-24 NOTE — Progress Notes (Signed)
Patient would like MD to order mammogram through [facility].

## 2015-07-24 NOTE — Progress Notes (Signed)
[place]  Telephone:(336) [phone]  Fax:(336) [phone]     Amanda Downs DOB: June 19, 1951  MR#: [id number]  BDZ#:[id number]  Patient Care Team: Chrisandra Carota, MD as PCP - General (Family Medicine)  CHIEF COMPLAINT:  Chief Complaint  Patient presents with  . Breast Cancer    INTERVAL HISTORY:  Patient is here for further follow up and treatment consideration regarding carcinoma of left breast, stage IC disease.  On Arimidex since March 2012 and tolerating treatment very well. She has had mammogram in May 2016, is due for yearly exam in May 2017. Patient is taking calcium and vitamin D. She denies any fever, chills, nausea, vomiting, diarrhea, cough, shortness of breath, or chest pain. She otherwise denies any specific complaints.  REVIEW OF SYSTEMS:   Review of Systems  Constitutional: Negative for fever, chills, weight loss, malaise/fatigue and diaphoresis.  HENT: Negative.   Eyes: Negative.   Respiratory: Negative for cough, hemoptysis, sputum production, shortness of breath and wheezing.   Cardiovascular: Negative for chest pain, palpitations, orthopnea, claudication, leg swelling and PND.  Gastrointestinal: Negative for heartburn, nausea, vomiting, abdominal pain, diarrhea, constipation, blood in stool and melena.  Genitourinary: Negative.   Musculoskeletal: Negative.   Skin: Negative.   Neurological: Negative for dizziness, tingling, focal weakness, seizures and weakness.  Endo/Heme/Allergies: Does not bruise/bleed easily.  Psychiatric/Behavioral: Negative for depression. The patient is not nervous/anxious and does not have insomnia.     As per HPI. Otherwise, a complete review of systems is negatve.  ONCOLOGY HISTORY: Oncology History   Chief Complaint/Diagnosis:   1. 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 2. Started on Arimidex in March of 2012     Breast CA [facility])   06/23/2010 Cancer Diagnosis T1c  N0 M0   06/29/2010 Surgery lumpectomy   07/06/2010 - 08/19/2010 Radiation Therapy    09/17/2010 -  Anti-estrogen oral therapy     PAST MEDICAL HISTORY: Past Medical History  Diagnosis Date  . CHF (congestive heart failure) ([place])   . GERD (gastroesophageal reflux disease)   . Diabetes mellitus without complication ([place])   . Mitral valve disorder   . Hypertension     PAST SURGICAL HISTORY: Past Surgical History  Procedure Laterality Date  . Tonsillectomy    . Breast surgery    . Abdominal hysterectomy      FAMILY HISTORY Family History  Problem Relation Age of Onset  . Colon cancer Mother   . Stomach cancer Mother     GYNECOLOGIC HISTORY:  Hysterectomy    ADVANCED DIRECTIVES:    HEALTH MAINTENANCE: Social History  Substance Use Topics  . Smoking status: Passive Smoke Exposure - Never Smoker  . Smokeless tobacco: Never Used  . Alcohol Use: No     Allergies  Allergen Reactions  . Rosuvastatin     Other reaction(s): Muscle Pain    Current Outpatient Prescriptions  Medication Sig Dispense Refill  . anastrozole (ARIMIDEX) 1 MG tablet Take 1 tablet (1 mg total) by mouth daily. 30 tablet 6  . cyanocobalamin (CVS VITAMIN B12) 2000 MCG tablet Take by mouth.    . esomeprazole (NEXIUM 24HR) 20 MG capsule TAKE 1 CAPSULE (40 MG TOTAL) BY MOUTH ONCE DAILY. OTC NEXIUM    . ezetimibe (ZETIA) 10 MG tablet Take by mouth.    . Flaxseed MISC Take by mouth.    . furosemide (LASIX) 40 MG tablet Take by mouth.    . magnesium oxide (  MAG-OX) 400 MG tablet Take by mouth.    . metFORMIN (GLUCOPHAGE) 500 MG tablet Take by mouth.    . Omega-3 Fatty Acids (FISH OIL) 1000 MG CAPS Take by mouth.    . ramipril (ALTACE) 10 MG capsule     . saccharomyces boulardii (FLORASTOR) 250 MG capsule Take by mouth.    . spironolactone (ALDACTONE) 25 MG tablet Take by mouth.    . Vitamin D, Cholecalciferol, 400 UNITS CAPS Take by mouth.     No current facility-administered medications for this visit.     OBJECTIVE: BP 114/75 mmHg  Pulse 63  Temp(Src) 97.3 F (36.3 C) (Tympanic)  Resp 18  Wt 145 lb 11.6 oz (66.1 kg)   There is no height on file to calculate BMI.    ECOG FS:0 - Asymptomatic  General: Well-developed, well-nourished, no acute distress. Eyes: Pink conjunctiva, anicteric sclera. HEENT: Normocephalic, moist mucous membranes, clear oropharnyx. Lungs: Clear to auscultation bilaterally. Heart: Regular rate and rhythm. No rubs, murmurs, or gallops. Abdomen: Soft, nontender, nondistended. No organomegaly noted, normoactive bowel sounds. Breast: Breast palpated in a circular manner in the sitting and supine positions.  No masses or fullness palpated.  Axilla palpated in both positions with no masses or fullness palpated.  Musculoskeletal: No edema, cyanosis, or clubbing. Neuro: Alert, answering all questions appropriately. Cranial nerves grossly intact. Skin: No rashes or petechiae noted. Psych: Normal affect. Lymphatics: No cervical, clavicular LAD.   LAB RESULTS:  Appointment on 07/24/2015  Component Date Value Ref Range Status  . Sodium 07/24/2015 129* 135 - 145 mmol/L Final  . Potassium 07/24/2015 4.3  3.5 - 5.1 mmol/L Final  . Chloride 07/24/2015 96* 101 - 111 mmol/L Final  . CO2 07/24/2015 25  22 - 32 mmol/L Final  . Glucose, Bld 07/24/2015 107* 65 - 99 mg/dL Final  . BUN 07/24/2015 16  6 - 20 mg/dL Final  . Creatinine, Ser 07/24/2015 1.09* 0.44 - 1.00 mg/dL Final  . Calcium 07/24/2015 9.3  8.9 - 10.3 mg/dL Final  . Total Protein 07/24/2015 6.7  6.5 - 8.1 g/dL Final  . Albumin 07/24/2015 4.1  3.5 - 5.0 g/dL Final  . AST 07/24/2015 21  15 - 41 U/L Final  . ALT 07/24/2015 18  14 - 54 U/L Final  . Alkaline Phosphatase 07/24/2015 83  38 - 126 U/L Final  . Total Bilirubin 07/24/2015 0.6  0.3 - 1.2 mg/dL Final  . GFR calc non Af Amer 07/24/2015 52* >60 mL/min Final  . GFR calc Af Amer 07/24/2015 >60  >60 mL/min Final   Comment: (NOTE) The eGFR has been calculated  using the CKD EPI equation. This calculation has not been validated in all clinical situations. eGFR's persistently <60 mL/min signify possible Chronic Kidney Disease.   . Anion gap 07/24/2015 8  5 - 15 Final  . WBC 07/24/2015 5.2  3.6 - 11.0 K/uL Final  . RBC 07/24/2015 4.14  3.80 - 5.20 MIL/uL Final  . Hemoglobin 07/24/2015 12.1  12.0 - 16.0 g/dL Final  . HCT 07/24/2015 36.2  35.0 - 47.0 % Final  . MCV 07/24/2015 87.4  80.0 - 100.0 fL Final  . MCH 07/24/2015 29.2  26.0 - 34.0 pg Final  . MCHC 07/24/2015 33.4  32.0 - 36.0 g/dL Final  . RDW 07/24/2015 14.2  11.5 - 14.5 % Final  . Platelets 07/24/2015 291  150 - 440 K/uL Final  . Neutrophils Relative % 07/24/2015 65   Final  . Neutro Abs 07/24/2015  3.4  1.4 - 6.5 K/uL Final  . Lymphocytes Relative 07/24/2015 23   Final  . Lymphs Abs 07/24/2015 1.2  1.0 - 3.6 K/uL Final  . Monocytes Relative 07/24/2015 10   Final  . Monocytes Absolute 07/24/2015 0.5  0.2 - 0.9 K/uL Final  . Eosinophils Relative 07/24/2015 3   Final  . Eosinophils Absolute 07/24/2015 0.1  0 - 0.7 K/uL Final  . Basophils Relative 07/24/2015 1   Final  . Basophils Absolute 07/24/2015 0.0  0 - 0.1 K/uL Final    STUDIES: No results found.  ASSESSMENT:  Breast Carcinoma.   PLAN:   1. Breast cancer. Stage IC. Originally diagnosed in December 2011, noted to have 2 different foci of tumor, ER/ PR positive, Her-2 negative. Oncotype score, low risk for recurrence. She underwent wide local excision and radiation therapy, started on anastrozole in March 2012. She is tolerating calcium, vitamin D, and anastrozole very well. Clinically, there is no evidence of recurrent disease.  Will order mammogram for 2017. Continue with anastrazole until next visit.  Will continue with routine follow up in 6 months.   Patient expressed understanding and was in agreement with this plan. She also understands that She can call clinic at any time with any questions, concerns, or complaints.    Dr. Oliva Bustard was available for consultation and review of plan of care for this patient.   Evlyn Kanner, NP   07/25/2015 9:05 AM

## 2015-07-25 ENCOUNTER — Encounter: Payer: Self-pay | Admitting: Oncology

## 2015-11-26 ENCOUNTER — Telehealth: Payer: Self-pay | Admitting: *Deleted

## 2015-11-26 NOTE — Telephone Encounter (Signed)
Asking if the order for her mammo was sent to [facility] in [place]. She is due May 12th and has not heard anything about an appt.

## 2015-12-08 ENCOUNTER — Other Ambulatory Visit: Payer: Self-pay | Admitting: Oncology

## 2015-12-16 ENCOUNTER — Encounter: Payer: Self-pay | Admitting: Oncology

## 2016-01-21 ENCOUNTER — Ambulatory Visit: Payer: Medicare Other | Admitting: Oncology

## 2016-01-21 ENCOUNTER — Other Ambulatory Visit: Payer: Medicare Other

## 2016-01-28 ENCOUNTER — Inpatient Hospital Stay: Payer: Medicare Other | Admitting: Family Medicine

## 2016-01-28 ENCOUNTER — Inpatient Hospital Stay: Payer: Medicare Other

## 2016-02-20 ENCOUNTER — Inpatient Hospital Stay (HOSPITAL_BASED_OUTPATIENT_CLINIC_OR_DEPARTMENT_OTHER): Payer: Medicare Other

## 2016-02-20 ENCOUNTER — Inpatient Hospital Stay: Payer: Medicare Other | Attending: Hematology and Oncology | Admitting: Hematology and Oncology

## 2016-02-20 ENCOUNTER — Other Ambulatory Visit: Payer: Self-pay | Admitting: *Deleted

## 2016-02-20 VITALS — BP 100/72 | HR 66 | Temp 97.9°F | Resp 18 | Wt 140.1 lb

## 2016-02-20 DIAGNOSIS — Z7984 Long term (current) use of oral hypoglycemic drugs: Secondary | ICD-10-CM | POA: Insufficient documentation

## 2016-02-20 DIAGNOSIS — I509 Heart failure, unspecified: Secondary | ICD-10-CM | POA: Diagnosis not present

## 2016-02-20 DIAGNOSIS — E871 Hypo-osmolality and hyponatremia: Secondary | ICD-10-CM | POA: Diagnosis not present

## 2016-02-20 DIAGNOSIS — E119 Type 2 diabetes mellitus without complications: Secondary | ICD-10-CM | POA: Insufficient documentation

## 2016-02-20 DIAGNOSIS — I10 Essential (primary) hypertension: Secondary | ICD-10-CM | POA: Insufficient documentation

## 2016-02-20 DIAGNOSIS — C50811 Malignant neoplasm of overlapping sites of right female breast: Secondary | ICD-10-CM | POA: Diagnosis present

## 2016-02-20 DIAGNOSIS — Z9011 Acquired absence of right breast and nipple: Secondary | ICD-10-CM

## 2016-02-20 DIAGNOSIS — Z923 Personal history of irradiation: Secondary | ICD-10-CM | POA: Diagnosis not present

## 2016-02-20 DIAGNOSIS — Z79811 Long term (current) use of aromatase inhibitors: Secondary | ICD-10-CM | POA: Diagnosis not present

## 2016-02-20 DIAGNOSIS — K219 Gastro-esophageal reflux disease without esophagitis: Secondary | ICD-10-CM | POA: Diagnosis not present

## 2016-02-20 DIAGNOSIS — C50912 Malignant neoplasm of unspecified site of left female breast: Secondary | ICD-10-CM

## 2016-02-20 DIAGNOSIS — Z170 Estrogen receptor positive status [ER+]: Secondary | ICD-10-CM | POA: Diagnosis not present

## 2016-02-20 DIAGNOSIS — Z79899 Other long term (current) drug therapy: Secondary | ICD-10-CM | POA: Insufficient documentation

## 2016-02-20 DIAGNOSIS — C50812 Malignant neoplasm of overlapping sites of left female breast: Secondary | ICD-10-CM | POA: Diagnosis present

## 2016-02-20 LAB — CBC WITH DIFFERENTIAL/PLATELET
Basophils Absolute: 0.1 10*3/uL (ref 0–0.1)
Basophils Relative: 1 %
Eosinophils Absolute: 0.2 10*3/uL (ref 0–0.7)
Eosinophils Relative: 2 %
HCT: 36.3 % (ref 35.0–47.0)
Hemoglobin: 12.4 g/dL (ref 12.0–16.0)
Lymphocytes Relative: 21 %
Lymphs Abs: 1.4 10*3/uL (ref 1.0–3.6)
MCH: 30.2 pg (ref 26.0–34.0)
MCHC: 34.2 g/dL (ref 32.0–36.0)
MCV: 88.5 fL (ref 80.0–100.0)
Monocytes Absolute: 0.5 10*3/uL (ref 0.2–0.9)
Monocytes Relative: 8 %
Neutro Abs: 4.7 10*3/uL (ref 1.4–6.5)
Neutrophils Relative %: 68 %
Platelets: 296 10*3/uL (ref 150–440)
RBC: 4.1 MIL/uL (ref 3.80–5.20)
RDW: 14 % (ref 11.5–14.5)
WBC: 6.8 10*3/uL (ref 3.6–11.0)

## 2016-02-20 LAB — COMPREHENSIVE METABOLIC PANEL
ALT: 28 U/L (ref 14–54)
AST: 30 U/L (ref 15–41)
Albumin: 4.3 g/dL (ref 3.5–5.0)
Alkaline Phosphatase: 86 U/L (ref 38–126)
Anion gap: 8 (ref 5–15)
BUN: 16 mg/dL (ref 6–20)
CO2: 27 mmol/L (ref 22–32)
Calcium: 9.4 mg/dL (ref 8.9–10.3)
Chloride: 96 mmol/L — ABNORMAL LOW (ref 101–111)
Creatinine, Ser: 0.96 mg/dL (ref 0.44–1.00)
GFR calc Af Amer: >60 mL/min (ref >60)
GFR calc non Af Amer: >60 mL/min (ref >60)
Glucose, Bld: 101 mg/dL — ABNORMAL HIGH (ref 65–99)
Potassium: 4.7 mmol/L (ref 3.5–5.1)
Sodium: 131 mmol/L — ABNORMAL LOW (ref 135–145)
Total Bilirubin: 0.7 mg/dL (ref 0.3–1.2)
Total Protein: 6.7 g/dL (ref 6.5–8.1)

## 2016-02-20 NOTE — Progress Notes (Signed)
Patient is here for follow up, she is doing well no complaints   Would like to ask about her medication if she is to take it for 5 years or 7.

## 2016-02-20 NOTE — Progress Notes (Signed)
[facility] day:  02/20/16  Chief Complaint: Amanda Downs is a 65 y.o. female with stage IC left breast cancer who is seen for reassessement.  HPI:  The patient was diagnosed with breast cancer on 06/23/2010.  She had 2 different foci of tumor.  Stage was T1cN0M0.  She underwent left partial mastectomy by Dr.Wilton Tamala Julian on 06/08/2010.  Pathology revealed a 1.1 cm grade I invasive carcinoma.  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  The more superficial smaller carcinoma extended to the inked margin at the caudal aspect in an area of 3 mm in linear extent.  One sentinel lymph node was negative.  DCIS was present.  She underwent re-excision for a positive margin.  She received radiation from 07/06/2010 - 08/19/2010.  She began Arimidex on 09/17/2010.  She underwent ultrasound guided core biopsy of a right breast mass on 12/02/2014 revealed benign adipose tissue and small fragments of a benign lymph node.  The patient was last seen by Georgeanne Nim, NP on 07/25/2015.  At that time, she was doing well.  Bilateral mammogram on 12/16/2015 revealed no evidence of malignancy.  She believes her last bone density study was at [facility] in [place] in 2013.  Symptomatically, she is doing well.  She denies any complaint.   Past Medical History:  Diagnosis Date  . CHF (congestive heart failure) ([place])   . Diabetes mellitus without complication ([place])   . GERD (gastroesophageal reflux disease)   . Hypertension   . Mitral valve disorder     Past Surgical History:  Procedure Laterality Date  . ABDOMINAL HYSTERECTOMY    . BREAST SURGERY    . ROUX-EN-Y GASTRIC BYPASS    . TONSILLECTOMY      Family History  Problem Relation Age of Onset  . Colon cancer Mother   . Stomach cancer Mother     Social History:  reports that she is a non-smoker but has been exposed to tobacco smoke. She has never used smokeless  tobacco. She reports that she does not drink alcohol or use drugs.  Thre patient lives in [place].  The patient is alone today.  Allergies:  Allergies  Allergen Reactions  . Rosuvastatin     Other reaction(s): Muscle Pain    Current Medications: Current Outpatient Prescriptions  Medication Sig Dispense Refill  . anastrozole (ARIMIDEX) 1 MG tablet TAKE 1 TABLET (1 MG TOTAL) BY MOUTH DAILY. 30 tablet 6  . carvedilol (COREG) 6.25 MG tablet Take 6.25 mg by mouth 2 (two) times daily.    . cyanocobalamin ([employer] VITAMIN B12) 2000 MCG tablet Take by mouth as needed.     Marland Kitchen esomeprazole (NEXIUM 24HR) 20 MG capsule take 1 capsule as needed    . ezetimibe (ZETIA) 10 MG tablet Take 10 mg by mouth daily.     . Flaxseed MISC Take by mouth.    . furosemide (LASIX) 40 MG tablet Take 40 mg by mouth daily.     . magnesium oxide (MAG-OX) 400 MG tablet Take by mouth.    . metFORMIN (GLUCOPHAGE) 500 MG tablet Take 500 mg by mouth 2 (two) times daily with a meal.     . Omega-3 Fatty Acids (FISH OIL) 1000 MG CAPS Take by mouth.    . polyethylene glycol powder (GLYCOLAX/MIRALAX) powder Take as directed for colonoscopy prep.    . ramipril (ALTACE) 5 MG capsule Take 5 mg by mouth daily.    Marland Kitchen  saccharomyces boulardii (FLORASTOR) 250 MG capsule Take 250 mg by mouth daily.     Marland Kitchen spironolactone (ALDACTONE) 25 MG tablet Take 25 mg by mouth daily.     Marland Kitchen triamcinolone lotion (KENALOG) 0.1 % Apply topically.    . Vitamin D, Cholecalciferol, 400 UNITS CAPS Take by mouth.     No current facility-administered medications for this visit.     Review of Systems:  GENERAL:  Feels good.  Active.  No fevers or sweats.  Intentional weight loss- gone to gym PERFORMANCE STATUS (ECOG):  0 HEENT:  No visual changes, runny nose, sore throat, mouth sores or tenderness. Lungs: No shortness of breath or cough.  No hemoptysis. Cardiac:  No chest pain, palpitations, orthopnea, or PND.  History of CHF. GI:  No nausea, vomiting, diarrhea,  constipation, melena or hematochezia. GU:  No urgency, frequency, dysuria, or hematuria. Musculoskeletal:  No back pain.  No joint pain.  No muscle tenderness. Extremities:  No pain or swelling. Skin:  No rashes or skin changes. Neuro:  No headache, numbness or weakness, balance or coordination issues. Endocrine:  No diabetes, thyroid issues, hot flashes or night sweats. Psych:  No mood changes, depression or anxiety. Pain:  No focal pain. Review of systems:  All other systems reviewed and found to be negative.  Physical Exam: Blood pressure 100/72, pulse 66, temperature 97.9 F (36.6 C), temperature source Tympanic, resp. rate 18, weight 140 lb 1.6 oz (63.6 kg). GENERAL:  Well developed, well nourished, woman sitting comfortably in the exam room in no acute distress. MENTAL STATUS:  Alert and oriented to person, place and time. HEAD:  Styled frosted hair.  Normocephalic, atraumatic, face symmetric, no Cushingoid features. EYES:  Hazel eyes.  Pupils equal round and reactive to light and accomodation.  No conjunctivitis or scleral icterus. ENT:  Oropharynx clear without lesion.  Tongue normal. Mucous membranes moist.  RESPIRATORY:  Clear to auscultation without rales, wheezes or rhonchi. CARDIOVASCULAR:  Regular rate and rhythm without murmur, rub or gallop. BREAST:  Right breast without masses, skin changes or nipple discharge.  Left breast with scarring at the 5 o'clock position.  No skin changes or nipple discharge.  ABDOMEN:  Soft, non-tender, with active bowel sounds, and no hepatosplenomegaly.  No masses. SKIN:  No rashes, ulcers or lesions. EXTREMITIES: No edema, no skin discoloration or tenderness.  No palpable cords. LYMPH NODES: No palpable cervical, supraclavicular, axillary or inguinal adenopathy  NEUROLOGICAL: Unremarkable. PSYCH:  Appropriate.   Appointment on 02/20/2016  Component Date Value Ref Range Status  . WBC 02/20/2016 6.8  3.6 - 11.0 K/uL Final  . RBC 02/20/2016  4.10  3.80 - 5.20 MIL/uL Final  . Hemoglobin 02/20/2016 12.4  12.0 - 16.0 g/dL Final  . HCT 02/20/2016 36.3  35.0 - 47.0 % Final  . MCV 02/20/2016 88.5  80.0 - 100.0 fL Final  . MCH 02/20/2016 30.2  26.0 - 34.0 pg Final  . MCHC 02/20/2016 34.2  32.0 - 36.0 g/dL Final  . RDW 02/20/2016 14.0  11.5 - 14.5 % Final  . Platelets 02/20/2016 296  150 - 440 K/uL Final  . Neutrophils Relative % 02/20/2016 68  % Final  . Neutro Abs 02/20/2016 4.7  1.4 - 6.5 K/uL Final  . Lymphocytes Relative 02/20/2016 21  % Final  . Lymphs Abs 02/20/2016 1.4  1.0 - 3.6 K/uL Final  . Monocytes Relative 02/20/2016 8  % Final  . Monocytes Absolute 02/20/2016 0.5  0.2 - 0.9 K/uL Final  .  Eosinophils Relative 02/20/2016 2  % Final  . Eosinophils Absolute 02/20/2016 0.2  0 - 0.7 K/uL Final  . Basophils Relative 02/20/2016 1  % Final  . Basophils Absolute 02/20/2016 0.1  0 - 0.1 K/uL Final  . Sodium 02/20/2016 131* 135 - 145 mmol/L Final  . Potassium 02/20/2016 4.7  3.5 - 5.1 mmol/L Final  . Chloride 02/20/2016 96* 101 - 111 mmol/L Final  . CO2 02/20/2016 27  22 - 32 mmol/L Final  . Glucose, Bld 02/20/2016 101* 65 - 99 mg/dL Final  . BUN 02/20/2016 16  6 - 20 mg/dL Final  . Creatinine, Ser 02/20/2016 0.96  0.44 - 1.00 mg/dL Final  . Calcium 02/20/2016 9.4  8.9 - 10.3 mg/dL Final  . Total Protein 02/20/2016 6.7  6.5 - 8.1 g/dL Final  . Albumin 02/20/2016 4.3  3.5 - 5.0 g/dL Final  . AST 02/20/2016 30  15 - 41 U/L Final  . ALT 02/20/2016 28  14 - 54 U/L Final  . Alkaline Phosphatase 02/20/2016 86  38 - 126 U/L Final  . Total Bilirubin 02/20/2016 0.7  0.3 - 1.2 mg/dL Final  . GFR calc non Af Amer 02/20/2016 >60  >60 mL/min Final  . GFR calc Af Amer 02/20/2016 >60  >60 mL/min Final   Comment: (NOTE) The eGFR has been calculated using the CKD EPI equation. This calculation has not been validated in all clinical situations. eGFR's persistently <60 mL/min signify possible Chronic Kidney Disease.   . Anion gap  02/20/2016 8  5 - 15 Final    Assessment:  Amanda Downs is a 65 y.o. female with stage IC left breast cancer s/p left partial mastectomy on 06/08/2010.  Pathology revealed a 1.1 cm grade I invasive carcinoma.  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  The more superficial smaller carcinoma extended to the inked margin at the caudal aspect in an area of 3 mm in linear extent.  One sentinel lymph node was negative.  DCIS was present.  She underwent re-excision for a positive margin.  Tumor was ER+, PR+, Her2/neu -.  Pathologic stage was T1cN0M0.  She received radiation from 07/06/2010 - 08/19/2010.  She began Arimidex on 09/17/2010.  Bilateral mammogram on 12/16/2015 revealed no evidence of malignancy.  She has chronic hyponatremia.  She is on spironolactone and Lasix.  Symptomatically, she is doing well.  Exam reveals post-operative changes.  Plan: 1.  Labs today:  CBC with diff, CMP, CA27.29. 2.  Review entire medical history, diagnosis and management of breast cancer.  Discuss last mammogram.  Discuss adjuvant hormonal therapy.  Discuss consideration of BCI testing for extended adjuvant hormonal therapy (5 versus 10 years).  Information provided. 3.  Discuss bone density study and bone thinning associated with aromatase inhibitors.  Discuss calcium and vitamin D. 4.  Schedule bone density study [facility]) 5.  BCI testing  6.  RTC after BCI testing   Lequita Asal, MD  02/20/2016, 12:49 PM

## 2016-02-21 LAB — CANCER ANTIGEN 27.29: CA 27.29: 16.4 U/mL (ref 0.0–38.6)

## 2016-02-22 ENCOUNTER — Encounter: Payer: Self-pay | Admitting: Hematology and Oncology

## 2016-03-15 DIAGNOSIS — M8580 Other specified disorders of bone density and structure, unspecified site: Secondary | ICD-10-CM | POA: Insufficient documentation

## 2016-03-15 DIAGNOSIS — M810 Age-related osteoporosis without current pathological fracture: Secondary | ICD-10-CM

## 2016-03-16 ENCOUNTER — Encounter: Payer: Self-pay | Admitting: Hematology and Oncology

## 2016-03-16 ENCOUNTER — Encounter: Payer: Self-pay | Admitting: *Deleted

## 2016-03-17 ENCOUNTER — Encounter: Payer: Self-pay | Admitting: Hematology and Oncology

## 2016-03-17 ENCOUNTER — Ambulatory Visit: Payer: Medicare Other | Admitting: Anesthesiology

## 2016-03-17 ENCOUNTER — Encounter
Admission: RE | Disposition: A | Discharge status: Home / Self Care | Payer: Self-pay | Source: Ambulatory Visit | Attending: Unknown Physician Specialty

## 2016-03-17 ENCOUNTER — Ambulatory Visit
Admission: RE | Admit: 2016-03-17 | Discharge: 2016-03-17 | Disposition: A | Discharge status: Home / Self Care | Payer: Medicare Other | Source: Ambulatory Visit | Attending: Unknown Physician Specialty | Admitting: Unknown Physician Specialty

## 2016-03-17 ENCOUNTER — Encounter: Payer: Self-pay | Admitting: *Deleted

## 2016-03-17 DIAGNOSIS — D122 Benign neoplasm of ascending colon: Secondary | ICD-10-CM | POA: Insufficient documentation

## 2016-03-17 DIAGNOSIS — Z7984 Long term (current) use of oral hypoglycemic drugs: Secondary | ICD-10-CM | POA: Diagnosis not present

## 2016-03-17 DIAGNOSIS — K648 Other hemorrhoids: Secondary | ICD-10-CM | POA: Diagnosis not present

## 2016-03-17 DIAGNOSIS — Z86010 Personal history of colonic polyps: Secondary | ICD-10-CM | POA: Insufficient documentation

## 2016-03-17 DIAGNOSIS — K219 Gastro-esophageal reflux disease without esophagitis: Secondary | ICD-10-CM | POA: Diagnosis not present

## 2016-03-17 DIAGNOSIS — I509 Heart failure, unspecified: Secondary | ICD-10-CM | POA: Diagnosis not present

## 2016-03-17 DIAGNOSIS — Z1211 Encounter for screening for malignant neoplasm of colon: Secondary | ICD-10-CM | POA: Diagnosis present

## 2016-03-17 DIAGNOSIS — E119 Type 2 diabetes mellitus without complications: Secondary | ICD-10-CM | POA: Diagnosis not present

## 2016-03-17 DIAGNOSIS — I110 Hypertensive heart disease with heart failure: Secondary | ICD-10-CM | POA: Insufficient documentation

## 2016-03-17 DIAGNOSIS — D120 Benign neoplasm of cecum: Secondary | ICD-10-CM | POA: Insufficient documentation

## 2016-03-17 DIAGNOSIS — Z79899 Other long term (current) drug therapy: Secondary | ICD-10-CM | POA: Insufficient documentation

## 2016-03-17 HISTORY — PX: COLONOSCOPY WITH PROPOFOL: SHX5780

## 2016-03-17 HISTORY — DX: Malignant (primary) neoplasm, unspecified: C80.1

## 2016-03-17 HISTORY — DX: Hyperlipidemia, unspecified: E78.5

## 2016-03-17 HISTORY — DX: Bariatric surgery status: Z98.84

## 2016-03-17 HISTORY — DX: Postsurgical malabsorption, not elsewhere classified: K91.2

## 2016-03-17 LAB — GLUCOSE, CAPILLARY: GLUCOSE-CAPILLARY: 93 mg/dL (ref 65–99)

## 2016-03-17 SURGERY — COLONOSCOPY WITH PROPOFOL
Anesthesia: General

## 2016-03-17 MED ORDER — SODIUM CHLORIDE 0.9 % IV SOLN
INTRAVENOUS | Status: DC
Start: 2016-03-17 — End: 2016-03-17

## 2016-03-17 MED ORDER — PROPOFOL 500 MG/50ML IV EMUL
INTRAVENOUS | Status: DC | PRN
  Administered 2016-03-17: 140 ug/kg/min via INTRAVENOUS

## 2016-03-17 MED ORDER — LIDOCAINE HCL (CARDIAC) 20 MG/ML IV SOLN
INTRAVENOUS | Status: DC | PRN
  Administered 2016-03-17: 30 mg via INTRAVENOUS

## 2016-03-17 MED ORDER — PHENYLEPHRINE HCL 10 MG/ML IJ SOLN
INTRAMUSCULAR | Status: DC | PRN
  Administered 2016-03-17 (×2): 50 ug via INTRAVENOUS

## 2016-03-17 MED ORDER — MIDAZOLAM HCL 2 MG/2ML IJ SOLN
INTRAMUSCULAR | Status: DC | PRN
  Administered 2016-03-17: 1 mg via INTRAVENOUS

## 2016-03-17 MED ORDER — SODIUM CHLORIDE 0.9 % IV SOLN
INTRAVENOUS | Status: DC
  Administered 2016-03-17: 1000 mL via INTRAVENOUS

## 2016-03-17 MED ORDER — PROPOFOL 10 MG/ML IV BOLUS
INTRAVENOUS | Status: DC | PRN
  Administered 2016-03-17 (×2): 20 mg via INTRAVENOUS

## 2016-03-17 NOTE — Transfer of Care (Signed)
Immediate Anesthesia Transfer of Care Note  Patient: Amanda Downs  Procedure(s) Performed: Procedure(s): COLONOSCOPY WITH PROPOFOL (N/A)  Patient Location: PACU  Anesthesia Type:General  Level of Consciousness: awake and alert   Airway & Oxygen Therapy: Patient Spontanous Breathing and Patient connected to nasal cannula oxygen  Post-op Assessment: Report given to RN and Post -op Vital signs reviewed and stable  Post vital signs: Reviewed and stable  Last Vitals:  Vitals:   03/17/16 0701 03/17/16 0818  BP: 118/76 (!) 99/56  Pulse: 69 67  Resp: 18 16  Temp: 36.4 C (!) 36 C    Last Pain:  Vitals:   03/17/16 0701  TempSrc: Tympanic         Complications: No apparent anesthesia complications

## 2016-03-17 NOTE — Anesthesia Preprocedure Evaluation (Signed)
Anesthesia Evaluation  Patient identified by MRN, date of birth, ID band Patient awake    Reviewed: Allergy & Precautions, NPO status , Patient's Chart, lab work & pertinent test results  History of Anesthesia Complications Negative for: history of anesthetic complications  Airway Mallampati: II       Dental   Pulmonary shortness of breath and with exertion,           Cardiovascular hypertension, Pt. on medications and Pt. on home beta blockers +CHF       Neuro/Psych    GI/Hepatic GERD (none since hiatal hernia repair and bariatric surgery)  Controlled,  Endo/Other  diabetes, Type 2, Oral Hypoglycemic Agents  Renal/GU      Musculoskeletal   Abdominal   Peds  Hematology   Anesthesia Other Findings   Reproductive/Obstetrics                             Anesthesia Physical Anesthesia Plan  ASA: III  Anesthesia Plan: General   Post-op Pain Management:    Induction: Intravenous  Airway Management Planned:   Additional Equipment:   Intra-op Plan:   Post-operative Plan:   Informed Consent: I have reviewed the patients History and Physical, chart, labs and discussed the procedure including the risks, benefits and alternatives for the proposed anesthesia with the patient or authorized representative who has indicated his/her understanding and acceptance.     Plan Discussed with:   Anesthesia Plan Comments:         Anesthesia Quick Evaluation

## 2016-03-17 NOTE — Anesthesia Procedure Notes (Signed)
Date/Time: 03/17/2016 7:50 AM Performed by: Johnna Acosta Pre-anesthesia Checklist: Patient identified, Emergency Drugs available, Suction available, Patient being monitored and Timeout performed Patient Re-evaluated:Patient Re-evaluated prior to inductionOxygen Delivery Method: Nasal cannula

## 2016-03-17 NOTE — Op Note (Signed)
[facility] Patient Name: Amanda Downs Procedure Date: 03/17/2016 7:41 AM MRN: [id number] Account #: [id number] Date of Birth: 06-05-1951 Admit Type: Outpatient Age: 65 Room: [facility] ENDO ROOM 4 Gender: Female Note Status: Finalized Procedure:            Colonoscopy Indications:          High risk colon cancer surveillance: Personal history                        of colonic polyps Providers:            Manya Silvas, MD Referring MD:         Bo Mcclintock. Vickki Muff (Referring MD) Medicines:            Propofol per Anesthesia Complications:        No immediate complications. Procedure:            Pre-Anesthesia Assessment:                       - After reviewing the risks and benefits, the patient                        was deemed in satisfactory condition to undergo the                        procedure.                       After obtaining informed consent, the colonoscope was                        passed under direct vision. Throughout the procedure,                        the patient's blood pressure, pulse, and oxygen                        saturations were monitored continuously. The                        Colonoscope was introduced through the anus and                        advanced to the the cecum, identified by appendiceal                        orifice and ileocecal valve. The colonoscopy was                        performed without difficulty. The patient tolerated the                        procedure well. The quality of the bowel preparation                        was excellent. Findings:      Two sessile polyps were found in the proximal ascending colon and cecum.       The polyps were diminutive in size. These polyps were removed with a       jumbo cold forceps. Resection and retrieval were complete.  Internal hemorrhoids were found during endoscopy. The hemorrhoids were       small and Grade I (internal hemorrhoids that do not  prolapse).      The exam was otherwise without abnormality. Impression:           - Two diminutive polyps in the proximal ascending colon                        and in the cecum, removed with a jumbo cold forceps.                        Resected and retrieved.                       - Internal hemorrhoids.                       - The examination was otherwise normal. Recommendation:       - Await pathology results. Manya Silvas, MD 03/17/2016 8:17:33 AM This report has been signed electronically. Number of Addenda: 0 Note Initiated On: 03/17/2016 7:41 AM Scope Withdrawal Time: 0 hours 18 minutes 15 seconds  Total Procedure Duration: 0 hours 26 minutes 24 seconds       [facility]

## 2016-03-17 NOTE — H&P (Signed)
Primary Care Physician:  Clarisse Gouge, MD Primary Gastroenterologist:  Dr. Vira Agar  Pre-Procedure History & Physical: HPI:  Amanda Downs is a 65 y.o. female is here for an colonoscopy.   Past Medical History:  Diagnosis Date  . Bariatric surgery status   . Cancer (HCC)    BREAST  . CHF (congestive heart failure) ([place])   . Diabetes mellitus without complication ([place])   . GERD (gastroesophageal reflux disease)   . Hyperlipemia   . Hypertension   . Mitral valve disorder   . Postsurgical malabsorption     Past Surgical History:  Procedure Laterality Date  . ABDOMINAL HYSTERECTOMY    . BREAST SURGERY Left   . HERNIA REPAIR    . ROUX-EN-Y GASTRIC BYPASS    . TONSILLECTOMY    . TUBAL LIGATION      Prior to Admission medications   Medication Sig Start Date End Date Taking? Authorizing Provider  carvedilol (COREG) 6.25 MG tablet Take 6.25 mg by mouth 2 (two) times daily. 01/18/16  Yes Historical Provider, MD  pantoprazole (PROTONIX) 40 MG tablet Take 40 mg by mouth daily.   Yes Historical Provider, MD  ramipril (ALTACE) 5 MG capsule Take 5 mg by mouth daily. 01/18/16  Yes Historical Provider, MD  anastrozole (ARIMIDEX) 1 MG tablet TAKE 1 TABLET (1 MG TOTAL) BY MOUTH DAILY. 12/08/15   Forest Gleason, MD  cyanocobalamin ([employer]S VITAMIN B12) 2000 MCG tablet Take by mouth as needed.     Historical Provider, MD  esomeprazole (NEXIUM 24HR) 20 MG capsule take 1 capsule as needed 01/03/15 02/20/16  Historical Provider, MD  ezetimibe (ZETIA) 10 MG tablet Take 10 mg by mouth daily.  11/18/14 02/20/16  Historical Provider, MD  Flaxseed MISC Take by mouth.    Historical Provider, MD  furosemide (LASIX) 40 MG tablet Take 40 mg by mouth daily.  07/12/14   Historical Provider, MD  magnesium oxide (MAG-OX) 400 MG tablet Take by mouth.    Historical Provider, MD  metFORMIN (GLUCOPHAGE) 500 MG tablet Take 500 mg by mouth 2 (two) times daily with a meal.  08/20/14 02/20/16  Historical Provider, MD  Omega-3  Fatty Acids (FISH OIL) 1000 MG CAPS Take by mouth.    Historical Provider, MD  polyethylene glycol powder (GLYCOLAX/MIRALAX) powder Take as directed for colonoscopy prep. 01/28/16   Historical Provider, MD  saccharomyces boulardii (FLORASTOR) 250 MG capsule Take 250 mg by mouth daily.     Historical Provider, MD  spironolactone (ALDACTONE) 25 MG tablet Take 25 mg by mouth daily.  07/12/14   Historical Provider, MD  triamcinolone lotion (KENALOG) 0.1 % Apply topically. 12/25/15 12/24/16  Historical Provider, MD  Vitamin D, Cholecalciferol, 400 UNITS CAPS Take by mouth.    Historical Provider, MD    Allergies as of 03/15/2016 - Review Complete 02/22/2016  Allergen Reaction Noted  . Rosuvastatin  01/20/2015    Family History  Problem Relation Age of Onset  . Colon cancer Mother   . Stomach cancer Mother     Social History   Social History  . Marital status: Married    Spouse name: N/A  . Number of children: N/A  . Years of education: N/A   Occupational History  . Not on file.   Social History Main Topics  . Smoking status: Passive Smoke Exposure - Never Smoker  . Smokeless tobacco: Never Used  . Alcohol use No  . Drug use: No  . Sexual activity: Not on file   Other  Topics Concern  . Not on file   Social History Narrative  . No narrative on file    Review of Systems: See HPI, otherwise negative ROS  Physical Exam: BP 118/76   Pulse 69   Temp 97.5 F (36.4 C) (Tympanic)   Resp 18   Ht 5' 3.5" (1.613 m)   Wt 63 kg (139 lb)   SpO2 100%   BMI 24.24 kg/m  General:   Alert,  pleasant and cooperative in NAD Head:  Normocephalic and atraumatic. Neck:  Supple; no masses or thyromegaly. Lungs:  Clear throughout to auscultation.    Heart:  Regular rate and rhythm. Abdomen:  Soft, nontender and nondistended. Normal bowel sounds, without guarding, and without rebound.   Neurologic:  Alert and  oriented x4;  grossly normal neurologically.  Impression/Plan: [name]  is here for an colonoscopy to be performed for [facility] colon polyps and family history of colon cancer in mother.  Risks, benefits, limitations, and alternatives regarding  colonoscopy have been reviewed with the patient.  Questions have been answered.  All parties agreeable.   Gaylyn Cheers, MD  03/17/2016, 7:39 AM

## 2016-03-17 NOTE — Anesthesia Postprocedure Evaluation (Signed)
Anesthesia Post Note  Patient: Amanda Downs  Procedure(s) Performed: Procedure(s) (LRB): COLONOSCOPY WITH PROPOFOL (N/A)  Patient location during evaluation: Endoscopy Anesthesia Type: General Level of consciousness: awake and alert Pain management: pain level controlled Vital Signs Assessment: post-procedure vital signs reviewed and stable Respiratory status: spontaneous breathing and respiratory function stable Cardiovascular status: stable Anesthetic complications: no    Last Vitals:  Vitals:   03/17/16 0701 03/17/16 0818  BP: 118/76 (!) 99/56  Pulse: 69 67  Resp: 18 16  Temp: 36.4 C (!) 36 C    Last Pain:  Vitals:   03/17/16 0701  TempSrc: Tympanic                 Catori Panozzo K

## 2016-03-18 ENCOUNTER — Encounter: Payer: Self-pay | Admitting: Unknown Physician Specialty

## 2016-03-18 LAB — SURGICAL PATHOLOGY

## 2016-03-19 ENCOUNTER — Inpatient Hospital Stay: Payer: Medicare Other | Attending: Hematology and Oncology | Admitting: Hematology and Oncology

## 2016-03-19 ENCOUNTER — Encounter: Payer: Self-pay | Admitting: Hematology and Oncology

## 2016-03-19 ENCOUNTER — Other Ambulatory Visit: Payer: Self-pay | Admitting: *Deleted

## 2016-03-19 VITALS — BP 111/74 | HR 73 | Temp 96.5°F | Resp 18 | Wt 142.1 lb

## 2016-03-19 DIAGNOSIS — Z923 Personal history of irradiation: Secondary | ICD-10-CM | POA: Diagnosis not present

## 2016-03-19 DIAGNOSIS — Z853 Personal history of malignant neoplasm of breast: Secondary | ICD-10-CM | POA: Diagnosis not present

## 2016-03-19 DIAGNOSIS — I38 Endocarditis, valve unspecified: Secondary | ICD-10-CM | POA: Insufficient documentation

## 2016-03-19 DIAGNOSIS — E785 Hyperlipidemia, unspecified: Secondary | ICD-10-CM | POA: Insufficient documentation

## 2016-03-19 DIAGNOSIS — I509 Heart failure, unspecified: Secondary | ICD-10-CM

## 2016-03-19 DIAGNOSIS — E119 Type 2 diabetes mellitus without complications: Secondary | ICD-10-CM | POA: Diagnosis not present

## 2016-03-19 DIAGNOSIS — Z79899 Other long term (current) drug therapy: Secondary | ICD-10-CM | POA: Diagnosis not present

## 2016-03-19 DIAGNOSIS — Z9884 Bariatric surgery status: Secondary | ICD-10-CM | POA: Diagnosis not present

## 2016-03-19 DIAGNOSIS — I10 Essential (primary) hypertension: Secondary | ICD-10-CM | POA: Diagnosis not present

## 2016-03-19 DIAGNOSIS — D122 Benign neoplasm of ascending colon: Secondary | ICD-10-CM | POA: Insufficient documentation

## 2016-03-19 DIAGNOSIS — M810 Age-related osteoporosis without current pathological fracture: Secondary | ICD-10-CM

## 2016-03-19 DIAGNOSIS — Z800 Family history of malignant neoplasm of digestive organs: Secondary | ICD-10-CM

## 2016-03-19 DIAGNOSIS — Z7984 Long term (current) use of oral hypoglycemic drugs: Secondary | ICD-10-CM | POA: Diagnosis not present

## 2016-03-19 DIAGNOSIS — Z86010 Personal history of colonic polyps: Secondary | ICD-10-CM | POA: Insufficient documentation

## 2016-03-19 DIAGNOSIS — M818 Other osteoporosis without current pathological fracture: Secondary | ICD-10-CM | POA: Diagnosis not present

## 2016-03-19 DIAGNOSIS — E871 Hypo-osmolality and hyponatremia: Secondary | ICD-10-CM | POA: Insufficient documentation

## 2016-03-19 DIAGNOSIS — C50912 Malignant neoplasm of unspecified site of left female breast: Secondary | ICD-10-CM

## 2016-03-19 DIAGNOSIS — K219 Gastro-esophageal reflux disease without esophagitis: Secondary | ICD-10-CM | POA: Diagnosis not present

## 2016-03-19 NOTE — Progress Notes (Signed)
[facility] day:  03/19/16  Chief Complaint: DAIL Amanda Downs is a 65 y.o. female with stage IC left breast cancer who is seen for reassessement.  HPI:  The patient was diagnosed with breast cancer on 02/20/2016.  At that time, she was seen for initial assessment by me.  Symptomatically, she was doing well.  Exam revealed post-operative changes.  CBC with diff and CA27.29 (16.4) were normal.  CMP revealed a sodium of 131.  At last visit, we discussed BCI testing.  Bone density study was scheduled.  We discussed calcium and vitamin.  BCI testing revealed a 3.6% risk of late recurrence between years 5 through 10. She was in the low risk category  (confidence interval between 1.3%-5.8%). The likelihood of benefit of extended endocrine therapy is low.  Bone density study on 03/15/2016 revealed osteoporosis. She has a T score of -2.7 in the left hip, -2.4 in the right hip, right femoral neck -1.8, -2.3 left femoral neck, and -2.0 in L1-L4.   Bone density has declined since last testing on 05/25/2012 where her left hip was -1.0, right hip -0.7, right femoral neck -1.5, left femoral neck -1.7 and -1.8 in L1-L4.  She had colonoscopy on 03/17/2016.  There was a tubular adenoma in the proximal ascending colon and cecum.  Both were negative for high-grade dysplasia or malignancy.  Symptomatically, she is doing well.  She denies any complaint.   Past Medical History:  Diagnosis Date  . Bariatric surgery status   . Cancer (HCC)    BREAST  . CHF (congestive heart failure) ([place])   . Diabetes mellitus without complication ([place])   . GERD (gastroesophageal reflux disease)   . Hyperlipemia   . Hypertension   . Mitral valve disorder   . Postsurgical malabsorption     Past Surgical History:  Procedure Laterality Date  . ABDOMINAL HYSTERECTOMY    . BREAST SURGERY Left   . COLONOSCOPY WITH PROPOFOL N/A 03/17/2016   Procedure: COLONOSCOPY WITH PROPOFOL;   Surgeon: Manya Silvas, MD;  Location: [facility] ENDOSCOPY;  Service: Endoscopy;  Laterality: N/A;  . HERNIA REPAIR    . ROUX-EN-Y GASTRIC BYPASS    . TONSILLECTOMY    . TUBAL LIGATION      Family History  Problem Relation Age of Onset  . Colon cancer Mother   . Stomach cancer Mother     Social History:  reports that she is a non-smoker but has been exposed to tobacco smoke. She has never used smokeless tobacco. She reports that she does not drink alcohol or use drugs.  Thre patient lives in [place].  The patient is alone today.  Allergies:  Allergies  Allergen Reactions  . Lipitor [Atorvastatin] Other (See Comments)  . Rosuvastatin     Other reaction(s): Muscle Pain  . Simvastatin Other (See Comments)    Current Medications: Current Outpatient Prescriptions  Medication Sig Dispense Refill  . anastrozole (ARIMIDEX) 1 MG tablet TAKE 1 TABLET (1 MG TOTAL) BY MOUTH DAILY. 30 tablet 6  . carvedilol (COREG) 6.25 MG tablet Take 6.25 mg by mouth 2 (two) times daily.    . cyanocobalamin ([employer] VITAMIN B12) 2000 MCG tablet Take by mouth as needed.     Marland Kitchen esomeprazole (NEXIUM 24HR) 20 MG capsule take 1 capsule as needed    . ezetimibe (ZETIA) 10 MG tablet Take 10 mg by mouth daily.     . Flaxseed MISC Take by mouth.    Marland Kitchen  furosemide (LASIX) 40 MG tablet Take 40 mg by mouth daily.     . magnesium oxide (MAG-OX) 400 MG tablet Take 400 mg by mouth daily.     . metFORMIN (GLUCOPHAGE) 500 MG tablet Take 500 mg by mouth 2 (two) times daily with a meal.     . Omega-3 Fatty Acids (FISH OIL) 1000 MG CAPS Take by mouth.    . pantoprazole (PROTONIX) 40 MG tablet Take 40 mg by mouth daily.    . polyethylene glycol powder (GLYCOLAX/MIRALAX) powder Take as directed for colonoscopy prep.    . ramipril (ALTACE) 5 MG capsule Take 5 mg by mouth 2 (two) times daily.     Marland Kitchen saccharomyces boulardii (FLORASTOR) 250 MG capsule Take 250 mg by mouth daily.     Marland Kitchen spironolactone (ALDACTONE) 25 MG tablet Take 25 mg by  mouth daily.     Marland Kitchen triamcinolone lotion (KENALOG) 0.1 % Apply topically.    . Vitamin D, Cholecalciferol, 400 UNITS CAPS Take by mouth.     No current facility-administered medications for this visit.     Review of Systems:  GENERAL:  Feels good.  Active.  No fevers or sweats. PERFORMANCE STATUS (ECOG):  0 HEENT:  No visual changes, runny nose, sore throat, mouth sores or tenderness. Lungs: No shortness of breath or cough.  No hemoptysis. Cardiac:  No chest pain, palpitations, orthopnea, or PND.  History of CHF. GI:  No nausea, vomiting, diarrhea, constipation, melena or hematochezia. GU:  No urgency, frequency, dysuria, or hematuria. Musculoskeletal:  No back pain.  No joint pain.  No muscle tenderness. Extremities:  No pain or swelling. Skin:  No rashes or skin changes. Neuro:  No headache, numbness or weakness, balance or coordination issues. Endocrine:  No diabetes, thyroid issues, hot flashes or night sweats. Psych:  No mood changes, depression or anxiety. Pain:  No focal pain. Review of systems:  All other systems reviewed and found to be negative.  Physical Exam: Blood pressure 111/74, pulse 73, temperature (!) 96.5 F (35.8 C), temperature source Tympanic, resp. rate 18, weight 142 lb 1.4 oz (64.4 kg), SpO2 (!) 0 %. GENERAL:  Well developed, well nourished, woman sitting comfortably in the exam room in no acute distress. MENTAL STATUS:  Alert and oriented to person, place and time. HEAD:  Styled frosted hair.  Normocephalic, atraumatic, face symmetric, no Cushingoid features. EYES:  Hazel eyes.  No conjunctivitis or scleral icterus. NEUROLOGICAL: Unremarkable. PSYCH:  Appropriate.   Admission on 03/17/2016, Discharged on 03/17/2016  Component Date Value Ref Range Status  . Glucose-Capillary 03/17/2016 93  65 - 99 mg/dL Final  . SURGICAL PATHOLOGY 03/18/2016    Final                   Value:Surgical Pathology CASE: [id number] PATIENT: Kinberly Lanuza Surgical  Pathology Report     SPECIMEN SUBMITTED: A. Colon polyp, proximal ascending; cbx B. Colon polyp, cecum; cbx  CLINICAL HISTORY: None provided  PRE-OPERATIVE DIAGNOSIS: P HX colon polyps  POST-OPERATIVE DIAGNOSIS: Colon polyps     DIAGNOSIS: A. COLON POLYP, PROXIMAL ASCENDING; COLD BIOPSY: - TUBULAR ADENOMA. - NEGATIVE FOR HIGH-GRADE DYSPLASIA AND MALIGNANCY.  B. COLON POLYP, CECUM; COLD BIOPSY: - TUBULAR ADENOMA. - NEGATIVE FOR HIGH-GRADE DYSPLASIA AND MALIGNANCY.   GROSS DESCRIPTION:  A. Labeled: proximal ascending colon polyp C BX  Tissue fragment(s): 1  Size: 0.4 cm  Description: tan  Entirely submitted in 1 cassette(s).   B. Labeled: cecum polyp C BX  Tissue  fragment(s): 1  Size: 0.4 cm  Description: tan  Entirely submitted in one cassette(s).    Final Diagnosis performed by Delorse Lek, MD.  Electronically signed 03/18/2016 10:37:05AM    The electron                         ic signature indicates that the named Attending Pathologist has evaluated the specimen  Technical component performed at [facility], [address], [place], [place] [address] Lab: [phone] Dir: Darrick Penna. Evette Doffing, MD  Professional component performed at [facility], [facility], [place], [place], [place] [address] Lab: [phone] Dir: Dellia Nims. Rubinas, MD      Assessment:  CYRIAH CHILDREY is a 65 y.o. female with stage IC left breast cancer s/p left partial mastectomy on 06/08/2010.  Pathology revealed a 1.1 cm grade I invasive carcinoma.  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  The more superficial smaller carcinoma extended to the inked margin at the caudal aspect in an area of 3 mm in linear extent.  One sentinel lymph node was negative.  DCIS was present.  She underwent re-excision for a positive margin.  Tumor was ER+, PR+, Her2/neu -.  Pathologic stage was T1cN0M0.  She received radiation from 07/06/2010  - 08/19/2010.  She began Arimidex on 09/17/2010.  Bilateral mammogram on 12/16/2015 revealed no evidence of malignancy.  CA27.29 was 16.4 on 02/20/2016.  BCI testing on 03/02/2016 revealed a 3.6% risk of late recurrence between years 5 through 10.  The likelihood of benefit of extended endocrine therapy is low.  Bone density study on 03/15/2016 revealed osteoporosis with a T score of -2.7 in the left hip.  She has chronic hyponatremia.  She is on spironolactone and Lasix.  Symptomatically, she is doing well.  Exam reveals post-operative changes.  Plan: 1.  Review labs from last visit. 2.  Review BCI testing.  Risk of recurrence low.  No benefit from extended adjuvant hormonal therapy. 3.  Discuss bone density study.  Patient has progressive osteoporosis.  Discuss calcium and vitamin D.  Discuss bisphosphonates (Fosamax, Actonel or Boniva) or Prolia every 6 months.  Side effects reviewed.  Discussed issues with kidney function, hypocalcemia, and rare osteonecrosis of the jaw.  Discuss dental evaluation and clearance. Patient interested in [place]. 4.  Discontinue Armidex. 5.  Preauth Prolia. 6.  Patient to obtain dental clearance for Prolia. 7.  RTC for labs (BMP) and Prolia when approved. 8.  RTC 6 months later for MD assess, labs (BMP) and Prolia. 9.  Discuss plans for yearly follow-up after next visit.   Lequita Asal, MD  03/19/2016, 2:56 PM

## 2016-03-19 NOTE — Progress Notes (Signed)
Patient is here for results no complaints today.

## 2016-03-30 ENCOUNTER — Inpatient Hospital Stay: Payer: Medicare Other

## 2016-03-30 DIAGNOSIS — C50912 Malignant neoplasm of unspecified site of left female breast: Secondary | ICD-10-CM

## 2016-03-30 DIAGNOSIS — Z853 Personal history of malignant neoplasm of breast: Secondary | ICD-10-CM | POA: Diagnosis not present

## 2016-03-30 DIAGNOSIS — M810 Age-related osteoporosis without current pathological fracture: Secondary | ICD-10-CM

## 2016-03-30 LAB — BASIC METABOLIC PANEL
Anion gap: 10 (ref 5–15)
BUN: 12 mg/dL (ref 6–20)
CO2: 26 mmol/L (ref 22–32)
Calcium: 9.3 mg/dL (ref 8.9–10.3)
Chloride: 95 mmol/L — ABNORMAL LOW (ref 101–111)
Creatinine, Ser: 1.04 mg/dL — ABNORMAL HIGH (ref 0.44–1.00)
GFR calc Af Amer: >60 mL/min (ref >60)
GFR calc non Af Amer: 55 mL/min — ABNORMAL LOW (ref >60)
Glucose, Bld: 98 mg/dL (ref 65–99)
Potassium: 3.8 mmol/L (ref 3.5–5.1)
Sodium: 131 mmol/L — ABNORMAL LOW (ref 135–145)

## 2016-03-30 MED ORDER — DENOSUMAB 60 MG/ML ~~LOC~~ SOLN
60.0000 mg | Freq: Once | SUBCUTANEOUS | Status: AC
  Administered 2016-03-30: 60 mg via SUBCUTANEOUS
  Filled 2016-03-30: qty 1

## 2016-05-21 ENCOUNTER — Other Ambulatory Visit: Payer: Self-pay | Admitting: Oncology

## 2016-09-17 ENCOUNTER — Other Ambulatory Visit: Payer: Medicare Other

## 2016-09-17 ENCOUNTER — Ambulatory Visit: Payer: Medicare Other

## 2016-09-17 ENCOUNTER — Ambulatory Visit: Payer: Medicare Other | Admitting: Oncology

## 2016-09-24 ENCOUNTER — Other Ambulatory Visit: Payer: Self-pay | Admitting: Hematology and Oncology

## 2016-09-24 ENCOUNTER — Encounter: Payer: Self-pay | Admitting: Hematology and Oncology

## 2016-09-24 ENCOUNTER — Inpatient Hospital Stay (HOSPITAL_BASED_OUTPATIENT_CLINIC_OR_DEPARTMENT_OTHER): Payer: Medicare Other | Admitting: Hematology and Oncology

## 2016-09-24 ENCOUNTER — Inpatient Hospital Stay: Payer: Medicare Other | Attending: Hematology and Oncology

## 2016-09-24 ENCOUNTER — Inpatient Hospital Stay: Payer: Medicare Other

## 2016-09-24 VITALS — BP 117/79 | HR 73 | Temp 98.6°F | Resp 18 | Wt 142.6 lb

## 2016-09-24 DIAGNOSIS — I10 Essential (primary) hypertension: Secondary | ICD-10-CM | POA: Insufficient documentation

## 2016-09-24 DIAGNOSIS — Z800 Family history of malignant neoplasm of digestive organs: Secondary | ICD-10-CM | POA: Diagnosis not present

## 2016-09-24 DIAGNOSIS — R110 Nausea: Secondary | ICD-10-CM | POA: Diagnosis not present

## 2016-09-24 DIAGNOSIS — E119 Type 2 diabetes mellitus without complications: Secondary | ICD-10-CM | POA: Insufficient documentation

## 2016-09-24 DIAGNOSIS — J329 Chronic sinusitis, unspecified: Secondary | ICD-10-CM | POA: Diagnosis not present

## 2016-09-24 DIAGNOSIS — I509 Heart failure, unspecified: Secondary | ICD-10-CM | POA: Insufficient documentation

## 2016-09-24 DIAGNOSIS — Z803 Family history of malignant neoplasm of breast: Secondary | ICD-10-CM | POA: Insufficient documentation

## 2016-09-24 DIAGNOSIS — M818 Other osteoporosis without current pathological fracture: Secondary | ICD-10-CM | POA: Insufficient documentation

## 2016-09-24 DIAGNOSIS — E871 Hypo-osmolality and hyponatremia: Secondary | ICD-10-CM

## 2016-09-24 DIAGNOSIS — K219 Gastro-esophageal reflux disease without esophagitis: Secondary | ICD-10-CM | POA: Diagnosis not present

## 2016-09-24 DIAGNOSIS — Z9012 Acquired absence of left breast and nipple: Secondary | ICD-10-CM

## 2016-09-24 DIAGNOSIS — Z79899 Other long term (current) drug therapy: Secondary | ICD-10-CM | POA: Insufficient documentation

## 2016-09-24 DIAGNOSIS — E785 Hyperlipidemia, unspecified: Secondary | ICD-10-CM | POA: Insufficient documentation

## 2016-09-24 DIAGNOSIS — Z170 Estrogen receptor positive status [ER+]: Secondary | ICD-10-CM

## 2016-09-24 DIAGNOSIS — M810 Age-related osteoporosis without current pathological fracture: Secondary | ICD-10-CM

## 2016-09-24 DIAGNOSIS — Z923 Personal history of irradiation: Secondary | ICD-10-CM | POA: Diagnosis not present

## 2016-09-24 DIAGNOSIS — Z853 Personal history of malignant neoplasm of breast: Secondary | ICD-10-CM

## 2016-09-24 DIAGNOSIS — C50912 Malignant neoplasm of unspecified site of left female breast: Secondary | ICD-10-CM

## 2016-09-24 DIAGNOSIS — Z9884 Bariatric surgery status: Secondary | ICD-10-CM

## 2016-09-24 DIAGNOSIS — Z7984 Long term (current) use of oral hypoglycemic drugs: Secondary | ICD-10-CM

## 2016-09-24 LAB — CBC WITH DIFFERENTIAL/PLATELET
Basophils Absolute: 0 10*3/uL (ref 0–0.1)
Basophils Relative: 1 %
Eosinophils Absolute: 0.1 10*3/uL (ref 0–0.7)
Eosinophils Relative: 2 %
HCT: 36.3 % (ref 35.0–47.0)
Hemoglobin: 12.6 g/dL (ref 12.0–16.0)
Lymphocytes Relative: 23 %
Lymphs Abs: 1.6 10*3/uL (ref 1.0–3.6)
MCH: 30.3 pg (ref 26.0–34.0)
MCHC: 34.7 g/dL (ref 32.0–36.0)
MCV: 87.4 fL (ref 80.0–100.0)
Monocytes Absolute: 0.7 10*3/uL (ref 0.2–0.9)
Monocytes Relative: 10 %
Neutro Abs: 4.4 10*3/uL (ref 1.4–6.5)
Neutrophils Relative %: 64 %
Platelets: 431 10*3/uL (ref 150–440)
RBC: 4.16 MIL/uL (ref 3.80–5.20)
RDW: 14.2 % (ref 11.5–14.5)
WBC: 6.8 10*3/uL (ref 3.6–11.0)

## 2016-09-24 LAB — COMPREHENSIVE METABOLIC PANEL
ALT: 30 U/L (ref 14–54)
AST: 34 U/L (ref 15–41)
Albumin: 4.2 g/dL (ref 3.5–5.0)
Alkaline Phosphatase: 59 U/L (ref 38–126)
Anion gap: 7 (ref 5–15)
BUN: 12 mg/dL (ref 6–20)
CO2: 29 mmol/L (ref 22–32)
Calcium: 9.8 mg/dL (ref 8.9–10.3)
Chloride: 93 mmol/L — ABNORMAL LOW (ref 101–111)
Creatinine, Ser: 1.07 mg/dL — ABNORMAL HIGH (ref 0.44–1.00)
GFR calc Af Amer: >60 mL/min (ref >60)
GFR calc non Af Amer: 53 mL/min — ABNORMAL LOW (ref >60)
Glucose, Bld: 101 mg/dL — ABNORMAL HIGH (ref 65–99)
Potassium: 4.8 mmol/L (ref 3.5–5.1)
Sodium: 129 mmol/L — ABNORMAL LOW (ref 135–145)
Total Bilirubin: 0.6 mg/dL (ref 0.3–1.2)
Total Protein: 7.4 g/dL (ref 6.5–8.1)

## 2016-09-24 MED ORDER — DENOSUMAB 60 MG/ML ~~LOC~~ SOLN
60.0000 mg | Freq: Once | SUBCUTANEOUS | Status: AC
  Administered 2016-09-24: 60 mg via SUBCUTANEOUS
  Filled 2016-09-24: qty 1

## 2016-09-24 NOTE — Progress Notes (Signed)
Patient states she has had a sinus infection.  Saw PCP who placed her on amoxicillin.  Currently taking.

## 2016-09-24 NOTE — Progress Notes (Signed)
[facility] day:  09/24/16  Chief Complaint: Amanda Downs is a 66 y.o. female with stage IC left breast cancer who is seen for 6 month assessment and continuation of twice yearly Prolia.  HPI:  The patient was last seen in the medical oncology clinic on 03/19/2017.  At that time, she was doing well.  Bone density study revealed progressive osteoporosis.  Labs revealed a sodium of 131.  She received Prolia on 03/30/2016.  She has followed up with dentistry.  She has 2 new crowns.  She is taking vitamin D only.  Calcium causes nausea.  She notes an issue with a chronic sinus infection.  She is on Augmentin.  She is taking "1/2 Lasix and 1/2 spironolactone".  She notes a thyroid issue.  She denies any breast concerns.   Past Medical History:  Diagnosis Date  . Bariatric surgery status   . Cancer (HCC)    BREAST  . CHF (congestive heart failure) ([place])   . Diabetes mellitus without complication ([place])   . GERD (gastroesophageal reflux disease)   . Hyperlipemia   . Hypertension   . Mitral valve disorder   . Postsurgical malabsorption     Past Surgical History:  Procedure Laterality Date  . ABDOMINAL HYSTERECTOMY    . BREAST SURGERY Left   . COLONOSCOPY WITH PROPOFOL N/A 03/17/2016   Procedure: COLONOSCOPY WITH PROPOFOL;  Surgeon: Manya Silvas, MD;  Location: [facility] ENDOSCOPY;  Service: Endoscopy;  Laterality: N/A;  . HERNIA REPAIR    . ROUX-EN-Y GASTRIC BYPASS    . TONSILLECTOMY    . TUBAL LIGATION      Family History  Problem Relation Age of Onset  . Colon cancer Mother   . Stomach cancer Mother     Social History:  reports that she is a non-smoker but has been exposed to tobacco smoke. She has never used smokeless tobacco. She reports that she does not drink alcohol or use drugs.  Thre patient lives in [place].  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  Allergies:  Allergies  Allergen Reactions   . Lipitor [Atorvastatin] Other (See Comments)  . Rosuvastatin     Other reaction(s): Muscle Pain  . Simvastatin Other (See Comments)    Current Medications: Current Outpatient Prescriptions  Medication Sig Dispense Refill  . amoxicillin-clavulanate (AUGMENTIN) 875-125 MG tablet Take 1 tablet by mouth 2 (two) times daily.    . carvedilol (COREG) 6.25 MG tablet Take 6.25 mg by mouth 2 (two) times daily.    . cyanocobalamin ([employer]S VITAMIN B12) 2000 MCG tablet Take by mouth as needed.     . Flaxseed MISC Take by mouth.    . furosemide (LASIX) 40 MG tablet Take 40 mg by mouth daily.     . magnesium oxide (MAG-OX) 400 MG tablet Take 400 mg by mouth daily.     . metFORMIN (GLUCOPHAGE) 500 MG tablet Take 500 mg by mouth 2 (two) times daily with a meal.     . Omega-3 Fatty Acids (FISH OIL) 1000 MG CAPS Take by mouth.    . pantoprazole (PROTONIX) 40 MG tablet Take 40 mg by mouth daily.    . polyethylene glycol powder (GLYCOLAX/MIRALAX) powder Take as directed for colonoscopy prep.    . ramipril (ALTACE) 5 MG capsule Take 5 mg by mouth 2 (two) times daily.     Marland Kitchen saccharomyces boulardii (FLORASTOR) 250 MG capsule Take  250 mg by mouth daily.     Marland Kitchen spironolactone (ALDACTONE) 25 MG tablet Take 25 mg by mouth daily.     Marland Kitchen triamcinolone lotion (KENALOG) 0.1 % Apply topically.    . Vitamin D, Cholecalciferol, 400 UNITS CAPS Take by mouth.    Marland Kitchen anastrozole (ARIMIDEX) 1 MG tablet TAKE 1 TABLET (1 MG TOTAL) BY MOUTH DAILY. (Patient not taking: Reported on 09/24/2016) 30 tablet 2  . esomeprazole (NEXIUM 24HR) 20 MG capsule take 1 capsule as needed    . ezetimibe (ZETIA) 10 MG tablet Take 10 mg by mouth daily.      No current facility-administered medications for this visit.     Review of Systems:  GENERAL:  Feels good.  Active.  No fevers or sweats.  Weight stable. PERFORMANCE STATUS (ECOG):  0 HEENT:  Sinus infection.  No visual changes, sore throat, mouth sores or tenderness. Lungs: No shortness of  breath or cough.  No hemoptysis. Cardiac:  No chest pain, palpitations, orthopnea, or PND.  History of CHF. GI:  No nausea, vomiting, diarrhea, constipation, melena or hematochezia. GU:  No urgency, frequency, dysuria, or hematuria. Musculoskeletal:  No back pain.  No joint pain.  No muscle tenderness. Extremities:  No pain or swelling. Skin:  No rashes or skin changes. Neuro:  No headache, numbness or weakness, balance or coordination issues. Endocrine:  No diabetes.  Thyroid issue (unsure).  No hot flashes or night sweats. Psych:  No mood changes, depression or anxiety. Pain:  No focal pain. Review of systems:  All other systems reviewed and found to be negative.  Physical Exam: Blood pressure 117/79, pulse 73, temperature 98.6 F (37 C), temperature source Tympanic, resp. rate 18, weight 142 lb 10.2 oz (64.7 kg). GENERAL:  Well developed, well nourished, woman sitting comfortably in the exam room in no acute distress. MENTAL STATUS:  Alert and oriented to person, place and time. HEAD:  Styled frosted hair.  Normocephalic, atraumatic, face symmetric, no Cushingoid features. EYES:  Hazel eyes.  Pupils equal round and reactive to light and accomodation.  No conjunctivitis or scleral icterus. ENT:  Oropharynx clear without lesion.  Tongue normal. Mucous membranes moist.  RESPIRATORY:  Clear to auscultation without rales, wheezes or rhonchi. CARDIOVASCULAR:  Regular rate and rhythm without murmur, rub or gallop. BREAST:  Right breast without masses, skin changes or nipple discharge.  Left breast with scarring at the 5 o'clock position.  No skin changes or nipple discharge.  ABDOMEN:  Soft, non-tender, with active bowel sounds, and no hepatosplenomegaly.  No masses. SKIN:  No rashes, ulcers or lesions. EXTREMITIES: No edema, no skin discoloration or tenderness.  No palpable cords. LYMPH NODES: No palpable cervical, supraclavicular, axillary or inguinal adenopathy  NEUROLOGICAL:  Unremarkable. PSYCH:  Appropriate.   Appointment on 09/24/2016  Component Date Value Ref Range Status  . WBC 09/24/2016 6.8  3.6 - 11.0 K/uL Final  . RBC 09/24/2016 4.16  3.80 - 5.20 MIL/uL Final  . Hemoglobin 09/24/2016 12.6  12.0 - 16.0 g/dL Final  . HCT 09/24/2016 36.3  35.0 - 47.0 % Final  . MCV 09/24/2016 87.4  80.0 - 100.0 fL Final  . MCH 09/24/2016 30.3  26.0 - 34.0 pg Final  . MCHC 09/24/2016 34.7  32.0 - 36.0 g/dL Final  . RDW 09/24/2016 14.2  11.5 - 14.5 % Final  . Platelets 09/24/2016 431  150 - 440 K/uL Final  . Neutrophils Relative % 09/24/2016 64  % Final  . Neutro Abs 09/24/2016  4.4  1.4 - 6.5 K/uL Final  . Lymphocytes Relative 09/24/2016 23  % Final  . Lymphs Abs 09/24/2016 1.6  1.0 - 3.6 K/uL Final  . Monocytes Relative 09/24/2016 10  % Final  . Monocytes Absolute 09/24/2016 0.7  0.2 - 0.9 K/uL Final  . Eosinophils Relative 09/24/2016 2  % Final  . Eosinophils Absolute 09/24/2016 0.1  0 - 0.7 K/uL Final  . Basophils Relative 09/24/2016 1  % Final  . Basophils Absolute 09/24/2016 0.0  0 - 0.1 K/uL Final  . Sodium 09/24/2016 129* 135 - 145 mmol/L Final  . Potassium 09/24/2016 4.8  3.5 - 5.1 mmol/L Final  . Chloride 09/24/2016 93* 101 - 111 mmol/L Final  . CO2 09/24/2016 29  22 - 32 mmol/L Final  . Glucose, Bld 09/24/2016 101* 65 - 99 mg/dL Final  . BUN 09/24/2016 12  6 - 20 mg/dL Final  . Creatinine, Ser 09/24/2016 1.07* 0.44 - 1.00 mg/dL Final  . Calcium 09/24/2016 9.8  8.9 - 10.3 mg/dL Final  . Total Protein 09/24/2016 7.4  6.5 - 8.1 g/dL Final  . Albumin 09/24/2016 4.2  3.5 - 5.0 g/dL Final  . AST 09/24/2016 34  15 - 41 U/L Final  . ALT 09/24/2016 30  14 - 54 U/L Final  . Alkaline Phosphatase 09/24/2016 59  38 - 126 U/L Final  . Total Bilirubin 09/24/2016 0.6  0.3 - 1.2 mg/dL Final  . GFR calc non Af Amer 09/24/2016 53* >60 mL/min Final  . GFR calc Af Amer 09/24/2016 >60  >60 mL/min Final   Comment: (NOTE) The eGFR has been calculated using the CKD EPI  equation. This calculation has not been validated in all clinical situations. eGFR's persistently <60 mL/min signify possible Chronic Kidney Disease.   . Anion gap 09/24/2016 7  5 - 15 Final    Assessment:  Aadhira R Mecham is a 65 y.o. female with stage IC left breast cancer s/p left partial mastectomy on 06/08/2010.  Pathology revealed a 1.1 cm grade I invasive carcinoma.  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  The more superficial smaller carcinoma extended to the inked margin at the caudal aspect in an area of 3 mm in linear extent.  One sentinel lymph node was negative.  DCIS was present.  She underwent re-excision for a positive margin.  Tumor was ER+, PR+, Her2/neu -.  Pathologic stage was T1cN0M0.  She received radiation from 07/06/2010 - 08/19/2010.  She was on Arimidex from 09/17/2010 - 03/19/2016.  Bilateral mammogram on 12/16/2015 revealed no evidence of malignancy.  CA27.29 has been followed:  24.1 on 02/18/2011, 20.9 on 09/09/2011, 24.7 on 05/11/2012, 21.7 on 06/19/2013, and 16.4 on 02/20/2016.  BCI testing on 03/02/2016 revealed a 3.6% risk of late recurrence between years 5 through 10.  The likelihood of benefit of extended endocrine therapy is low.  Bone density study on 03/15/2016 revealed osteoporosis with a T score of -2.7 in the left hip.  She began Prolia on 03/30/2016.  She has chronic hyponatremia.  She is on spironolactone and Lasix.  Symptomatically, she is has some sinus issues.  She denies any breast concerns.  Exam reveals post-operative changes.  Sodium is 129.  Plan: 1.  Labs today:  CBC with diff, CMP, CA27.29. 2.  Discuss calcium and vitamin D.  Patient to try calcium chews. 3.  Prolia today. 4.  Bilateral mammogram ([facility]) 12/15/2016. 5.  RTC in 6 months for MD   assessment, labs (BMP), and Prolia.   Lequita Asal, MD  09/24/2016, 11:08 AM

## 2017-02-01 ENCOUNTER — Other Ambulatory Visit: Payer: Self-pay | Admitting: Hematology and Oncology

## 2017-02-01 DIAGNOSIS — Z170 Estrogen receptor positive status [ER+]: Principal | ICD-10-CM

## 2017-02-01 DIAGNOSIS — C50912 Malignant neoplasm of unspecified site of left female breast: Secondary | ICD-10-CM

## 2017-03-27 NOTE — Progress Notes (Signed)
[facility] day:  03/28/17  Chief Complaint: Amanda Downs is a 66 y.o. female with stage IC left breast cancer who is seen for 6 month assessment and continuation of twice yearly Prolia.  HPI:  The patient was last seen in the medical oncology clinic on 09/24/2016.  At that time, she noted chronic sinus issues.  She denied any breast concerns.  Exam revealed post-operative changes.  Sodium was 129 (chronic).  She received Prolia.  She underwent mammogram at [facility] on 12/15/2016.  Imaging revealed no evidence of malignancy.  During the interim, she has done well. She is taking her calcium and vitamin D supplements as prescribed. She has not had any dental work sine her last visit. She denies pain, sweats, fever, weight loss, and recent infections.   She denies any breast concerns.   Past Medical History:  Diagnosis Date  . Bariatric surgery status   . Cancer (HCC)    BREAST  . CHF (congestive heart failure) ([place])   . Diabetes mellitus without complication ([place])   . GERD (gastroesophageal reflux disease)   . Hyperlipemia   . Hypertension   . Mitral valve disorder   . Postsurgical malabsorption     Past Surgical History:  Procedure Laterality Date  . ABDOMINAL HYSTERECTOMY    . BREAST SURGERY Left   . COLONOSCOPY WITH PROPOFOL N/A 03/17/2016   Procedure: COLONOSCOPY WITH PROPOFOL;  Surgeon: Manya Silvas, MD;  Location: [facility] ENDOSCOPY;  Service: Endoscopy;  Laterality: N/A;  . HERNIA REPAIR    . ROUX-EN-Y GASTRIC BYPASS    . TONSILLECTOMY    . TUBAL LIGATION      Family History  Problem Relation Age of Onset  . Colon cancer Mother   . Stomach cancer Mother     Social History:  reports that she is a non-smoker but has been exposed to tobacco smoke. She has never used smokeless tobacco. She reports that she does not drink alcohol or use drugs.  Thre patient lives in [place].  She is back to work as a Programmer, multimedia.   The patient is alone today.  Allergies:  Allergies  Allergen Reactions  . Lipitor [Atorvastatin] Other (See Comments)  . Rosuvastatin     Other reaction(s): Muscle Pain  . Simvastatin Other (See Comments)    Current Medications: Current Outpatient Prescriptions  Medication Sig Dispense Refill  . carvedilol (COREG) 6.25 MG tablet Take 6.25 mg by mouth 2 (two) times daily.    . cyanocobalamin ([employer] VITAMIN B12) 2000 MCG tablet Take by mouth as needed.     . furosemide (LASIX) 40 MG tablet Take 40 mg by mouth daily.     . magnesium oxide (MAG-OX) 400 MG tablet Take 400 mg by mouth daily.     . Omega-3 Fatty Acids (FISH OIL) 1000 MG CAPS Take by mouth.    . pantoprazole (PROTONIX) 40 MG tablet Take 40 mg by mouth daily.    . polyethylene glycol powder (GLYCOLAX/MIRALAX) powder Take as directed for colonoscopy prep.    . ramipril (ALTACE) 5 MG capsule Take 5 mg by mouth 2 (two) times daily.     Marland Kitchen saccharomyces boulardii (FLORASTOR) 250 MG capsule Take 250 mg by mouth daily.     Marland Kitchen spironolactone (ALDACTONE) 25 MG tablet Take 25 mg by mouth daily.     . Vitamin D, Cholecalciferol, 400 UNITS CAPS Take by mouth.    Marland Kitchen amoxicillin-clavulanate (AUGMENTIN) 875-125 MG tablet Take  1 tablet by mouth 2 (two) times daily.    Marland Kitchen anastrozole (ARIMIDEX) 1 MG tablet TAKE 1 TABLET (1 MG TOTAL) BY MOUTH DAILY. (Patient not taking: Reported on 09/24/2016) 30 tablet 2  . esomeprazole (NEXIUM 24HR) 20 MG capsule take 1 capsule as needed    . ezetimibe (ZETIA) 10 MG tablet Take 10 mg by mouth daily.     . metFORMIN (GLUCOPHAGE) 500 MG tablet Take 500 mg by mouth 2 (two) times daily with a meal.      No current facility-administered medications for this visit.     Review of Systems:  GENERAL:  Feels good.  No fevers or sweats.  Weight up 6 pounds since last visit PERFORMANCE STATUS (ECOG):  0 HEENT:  No visual changes, sore throat, mouth sores or tenderness. Lungs: No shortness of breath or cough.  No  hemoptysis. Cardiac:  No chest pain, palpitations, orthopnea, or PND.  History of CHF (sees Dr. Nehemiah Massed). GI:  No nausea, vomiting, diarrhea, constipation, melena or hematochezia. GU:  No urgency, frequency, dysuria, or hematuria. Musculoskeletal:  No back pain.  No joint pain.  No muscle tenderness. Extremities:  No pain or swelling. Skin:  No rashes or skin changes. Neuro:  No headache, numbness or weakness, balance or coordination issues. Endocrine:  No diabetes.  Thyroid issue (unsure).  No hot flashes or night sweats. Psych:  No mood changes, depression or anxiety. Pain:  No focal pain. Review of systems:  All other systems reviewed and found to be negative.  Physical Exam: Blood pressure 109/72, pulse 60, temperature 97.8 F (36.6 C), temperature source Tympanic, resp. rate 16, weight 148 lb 8 oz (67.4 kg). GENERAL:  Well developed, well nourished, woman sitting comfortably in the exam room in no acute distress. MENTAL STATUS:  Alert and oriented to person, place and time. HEAD:  Styled frosted hair.  Normocephalic, atraumatic, face symmetric, no Cushingoid features. EYES:  Hazel eyes.  Pupils equal round and reactive to light and accomodation.  No conjunctivitis or scleral icterus. ENT:  Oropharynx clear without lesion.  Tongue normal. Mucous membranes moist.  RESPIRATORY:  Clear to auscultation without rales, wheezes or rhonchi. CARDIOVASCULAR:  Regular rate and rhythm without murmur, rub or gallop. BREAST:  Right breast without masses, skin changes or nipple discharge.  Left breast with scarring at the 5 o'clock position. Radiation changes (mild inferior edema).  No skin changes or nipple discharge.  ABDOMEN:  Soft, non-tender, with active bowel sounds, and no hepatosplenomegaly.  No masses. SKIN:  No rashes, ulcers or lesions. EXTREMITIES: No edema, no skin discoloration or tenderness.  No palpable cords. LYMPH NODES: No palpable cervical, supraclavicular, axillary or inguinal  adenopathy  NEUROLOGICAL: Unremarkable. PSYCH:  Appropriate.   Appointment on 03/28/2017  Component Date Value Ref Range Status  . WBC 03/28/2017 5.1  3.6 - 11.0 K/uL Final  . RBC 03/28/2017 3.96  3.80 - 5.20 MIL/uL Final  . Hemoglobin 03/28/2017 11.8* 12.0 - 16.0 g/dL Final  . HCT 03/28/2017 34.7* 35.0 - 47.0 % Final  . MCV 03/28/2017 87.7  80.0 - 100.0 fL Final  . MCH 03/28/2017 29.8  26.0 - 34.0 pg Final  . MCHC 03/28/2017 34.0  32.0 - 36.0 g/dL Final  . RDW 03/28/2017 14.5  11.5 - 14.5 % Final  . Platelets 03/28/2017 297  150 - 440 K/uL Final  . Neutrophils Relative % 03/28/2017 65  % Final  . Neutro Abs 03/28/2017 3.3  1.4 - 6.5 K/uL Final  . Lymphocytes  Relative 03/28/2017 22  % Final  . Lymphs Abs 03/28/2017 1.1  1.0 - 3.6 K/uL Final  . Monocytes Relative 03/28/2017 10  % Final  . Monocytes Absolute 03/28/2017 0.5  0.2 - 0.9 K/uL Final  . Eosinophils Relative 03/28/2017 2  % Final  . Eosinophils Absolute 03/28/2017 0.1  0 - 0.7 K/uL Final  . Basophils Relative 03/28/2017 1  % Final  . Basophils Absolute 03/28/2017 0.1  0 - 0.1 K/uL Final  . Sodium 03/28/2017 129* 135 - 145 mmol/L Final  . Potassium 03/28/2017 4.3  3.5 - 5.1 mmol/L Final  . Chloride 03/28/2017 96* 101 - 111 mmol/L Final  . CO2 03/28/2017 25  22 - 32 mmol/L Final  . Glucose, Bld 03/28/2017 113* 65 - 99 mg/dL Final  . BUN 03/28/2017 13  6 - 20 mg/dL Final  . Creatinine, Ser 03/28/2017 1.00  0.44 - 1.00 mg/dL Final  . Calcium 03/28/2017 9.2  8.9 - 10.3 mg/dL Final  . Total Protein 03/28/2017 6.7  6.5 - 8.1 g/dL Final  . Albumin 03/28/2017 4.1  3.5 - 5.0 g/dL Final  . AST 03/28/2017 30  15 - 41 U/L Final  . ALT 03/28/2017 24  14 - 54 U/L Final  . Alkaline Phosphatase 03/28/2017 38  38 - 126 U/L Final  . Total Bilirubin 03/28/2017 0.7  0.3 - 1.2 mg/dL Final  . GFR calc non Af Amer 03/28/2017 57* >60 mL/min Final  . GFR calc Af Amer 03/28/2017 >60  >60 mL/min Final   Comment: (NOTE) The eGFR has been  calculated using the CKD EPI equation. This calculation has not been validated in all clinical situations. eGFR's persistently <60 mL/min signify possible Chronic Kidney Disease.   . Anion gap 03/28/2017 8  5 - 15 Final    Assessment:  ALANTRA POPOCA is a 66 y.o. female with stage IC left breast cancer s/p left partial mastectomy on 06/08/2010.  Pathology revealed a 1.1 cm grade I invasive carcinoma.  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  The more superficial smaller carcinoma extended to the inked margin at the caudal aspect in an area of 3 mm in linear extent.  One sentinel lymph node was negative.  DCIS was present.  She underwent re-excision for a positive margin.  Tumor was ER+, PR+, Her2/neu -.  Pathologic stage was T1cN0M0.  She received radiation from 07/06/2010 - 08/19/2010.  She was on Arimidex from 09/17/2010 - 03/19/2016.  Bilateral mammogram on 12/16/2015 revealed no evidence of malignancy.  CA27.29 has been followed:  24.1 on 02/18/2011, 20.9 on 09/09/2011, 24.7 on 05/11/2012, 21.7 on 06/19/2013, and 16.4 on 02/20/2016.  BCI testing on 03/02/2016 revealed a 3.6% risk of late recurrence between years 5 through 10.  The likelihood of benefit of extended endocrine therapy is low.  Bone density study on 03/15/2016 revealed osteoporosis with a T score of -2.7 in the left hip.  She began Prolia on 03/30/2016 (last 09/24/2016).  She has chronic hyponatremia.  She is on spironolactone and Lasix.  Symptomatically, she is doing well.  She denies any breast concerns.  Exam reveals post-operative changes.  Sodium is 129.  Plan: 1.  Labs today:  CBC with diff, CMP, CA27.29. 2.  Prolia today. 3.  RTC in 6 months for MD assessment,  labs (BMP), and Prolia.   Honor Loh, NP  03/28/2017, 11:08 AM   I saw and evaluated the patient, participating in the key portions of  the service and reviewing pertinent diagnostic studies and records.  I reviewed the  nurse practitioner's note and agree with the findings and the plan.  The assessment and plan were discussed with the patient.  A few questions were asked by the patient and answered.   Lequita Asal, MD 04/02/2017,3:26 PM

## 2017-03-28 ENCOUNTER — Other Ambulatory Visit: Payer: Self-pay | Admitting: *Deleted

## 2017-03-28 ENCOUNTER — Inpatient Hospital Stay (HOSPITAL_BASED_OUTPATIENT_CLINIC_OR_DEPARTMENT_OTHER): Payer: Medicare Other | Admitting: Hematology and Oncology

## 2017-03-28 ENCOUNTER — Inpatient Hospital Stay: Payer: Medicare Other

## 2017-03-28 ENCOUNTER — Inpatient Hospital Stay: Payer: Medicare Other | Attending: Hematology and Oncology

## 2017-03-28 VITALS — BP 109/72 | HR 60 | Temp 97.8°F | Resp 16 | Wt 148.5 lb

## 2017-03-28 DIAGNOSIS — Z800 Family history of malignant neoplasm of digestive organs: Secondary | ICD-10-CM | POA: Insufficient documentation

## 2017-03-28 DIAGNOSIS — E119 Type 2 diabetes mellitus without complications: Secondary | ICD-10-CM | POA: Diagnosis not present

## 2017-03-28 DIAGNOSIS — M810 Age-related osteoporosis without current pathological fracture: Secondary | ICD-10-CM

## 2017-03-28 DIAGNOSIS — Z853 Personal history of malignant neoplasm of breast: Secondary | ICD-10-CM | POA: Insufficient documentation

## 2017-03-28 DIAGNOSIS — E871 Hypo-osmolality and hyponatremia: Secondary | ICD-10-CM

## 2017-03-28 DIAGNOSIS — E785 Hyperlipidemia, unspecified: Secondary | ICD-10-CM

## 2017-03-28 DIAGNOSIS — Z79899 Other long term (current) drug therapy: Secondary | ICD-10-CM | POA: Diagnosis not present

## 2017-03-28 DIAGNOSIS — Z170 Estrogen receptor positive status [ER+]: Secondary | ICD-10-CM

## 2017-03-28 DIAGNOSIS — I10 Essential (primary) hypertension: Secondary | ICD-10-CM | POA: Diagnosis not present

## 2017-03-28 DIAGNOSIS — I509 Heart failure, unspecified: Secondary | ICD-10-CM

## 2017-03-28 DIAGNOSIS — Z923 Personal history of irradiation: Secondary | ICD-10-CM | POA: Diagnosis not present

## 2017-03-28 DIAGNOSIS — Z9884 Bariatric surgery status: Secondary | ICD-10-CM | POA: Diagnosis not present

## 2017-03-28 DIAGNOSIS — K219 Gastro-esophageal reflux disease without esophagitis: Secondary | ICD-10-CM | POA: Insufficient documentation

## 2017-03-28 DIAGNOSIS — C50912 Malignant neoplasm of unspecified site of left female breast: Secondary | ICD-10-CM

## 2017-03-28 LAB — COMPREHENSIVE METABOLIC PANEL
ALT: 24 U/L (ref 14–54)
AST: 30 U/L (ref 15–41)
Albumin: 4.1 g/dL (ref 3.5–5.0)
Alkaline Phosphatase: 38 U/L (ref 38–126)
Anion gap: 8 (ref 5–15)
BUN: 13 mg/dL (ref 6–20)
CO2: 25 mmol/L (ref 22–32)
Calcium: 9.2 mg/dL (ref 8.9–10.3)
Chloride: 96 mmol/L — ABNORMAL LOW (ref 101–111)
Creatinine, Ser: 1 mg/dL (ref 0.44–1.00)
GFR calc Af Amer: >60 mL/min (ref >60)
GFR calc non Af Amer: 57 mL/min — ABNORMAL LOW (ref >60)
Glucose, Bld: 113 mg/dL — ABNORMAL HIGH (ref 65–99)
Potassium: 4.3 mmol/L (ref 3.5–5.1)
Sodium: 129 mmol/L — ABNORMAL LOW (ref 135–145)
Total Bilirubin: 0.7 mg/dL (ref 0.3–1.2)
Total Protein: 6.7 g/dL (ref 6.5–8.1)

## 2017-03-28 LAB — CBC WITH DIFFERENTIAL/PLATELET
Basophils Absolute: 0.1 10*3/uL (ref 0–0.1)
Basophils Relative: 1 %
Eosinophils Absolute: 0.1 10*3/uL (ref 0–0.7)
Eosinophils Relative: 2 %
HCT: 34.7 % — ABNORMAL LOW (ref 35.0–47.0)
Hemoglobin: 11.8 g/dL — ABNORMAL LOW (ref 12.0–16.0)
Lymphocytes Relative: 22 %
Lymphs Abs: 1.1 10*3/uL (ref 1.0–3.6)
MCH: 29.8 pg (ref 26.0–34.0)
MCHC: 34 g/dL (ref 32.0–36.0)
MCV: 87.7 fL (ref 80.0–100.0)
Monocytes Absolute: 0.5 10*3/uL (ref 0.2–0.9)
Monocytes Relative: 10 %
Neutro Abs: 3.3 10*3/uL (ref 1.4–6.5)
Neutrophils Relative %: 65 %
Platelets: 297 10*3/uL (ref 150–440)
RBC: 3.96 MIL/uL (ref 3.80–5.20)
RDW: 14.5 % (ref 11.5–14.5)
WBC: 5.1 10*3/uL (ref 3.6–11.0)

## 2017-03-28 MED ORDER — DENOSUMAB 60 MG/ML ~~LOC~~ SOLN
60.0000 mg | Freq: Once | SUBCUTANEOUS | Status: AC
  Administered 2017-03-28: 60 mg via SUBCUTANEOUS
  Filled 2017-03-28 (×2): qty 1

## 2017-03-28 NOTE — Progress Notes (Signed)
Patient had her mammogram at [facility] radiology [facility] office [phone]) they are faxing report over now.  Patient does not offer any problems today.

## 2017-03-29 LAB — CANCER ANTIGEN 27.29: CA 27.29: 16.1 U/mL (ref 0.0–38.6)

## 2017-04-02 ENCOUNTER — Encounter: Payer: Self-pay | Admitting: Hematology and Oncology

## 2017-05-21 ENCOUNTER — Encounter: Payer: Self-pay | Admitting: Hematology and Oncology

## 2017-06-04 ENCOUNTER — Encounter: Payer: Self-pay | Admitting: Hematology and Oncology

## 2017-09-26 ENCOUNTER — Inpatient Hospital Stay (HOSPITAL_BASED_OUTPATIENT_CLINIC_OR_DEPARTMENT_OTHER): Payer: Medicare Other | Admitting: Hematology and Oncology

## 2017-09-26 ENCOUNTER — Inpatient Hospital Stay: Payer: Medicare Other

## 2017-09-26 ENCOUNTER — Encounter: Payer: Self-pay | Admitting: Hematology and Oncology

## 2017-09-26 ENCOUNTER — Other Ambulatory Visit: Payer: Self-pay

## 2017-09-26 ENCOUNTER — Inpatient Hospital Stay: Payer: Medicare Other | Attending: Hematology and Oncology

## 2017-09-26 VITALS — BP 119/75 | HR 76 | Temp 97.6°F | Wt 155.1 lb

## 2017-09-26 DIAGNOSIS — M810 Age-related osteoporosis without current pathological fracture: Secondary | ICD-10-CM | POA: Insufficient documentation

## 2017-09-26 DIAGNOSIS — E785 Hyperlipidemia, unspecified: Secondary | ICD-10-CM

## 2017-09-26 DIAGNOSIS — Z853 Personal history of malignant neoplasm of breast: Secondary | ICD-10-CM | POA: Diagnosis present

## 2017-09-26 DIAGNOSIS — Z800 Family history of malignant neoplasm of digestive organs: Secondary | ICD-10-CM | POA: Diagnosis not present

## 2017-09-26 DIAGNOSIS — I509 Heart failure, unspecified: Secondary | ICD-10-CM | POA: Diagnosis not present

## 2017-09-26 DIAGNOSIS — I10 Essential (primary) hypertension: Secondary | ICD-10-CM

## 2017-09-26 DIAGNOSIS — Z9884 Bariatric surgery status: Secondary | ICD-10-CM | POA: Insufficient documentation

## 2017-09-26 DIAGNOSIS — E871 Hypo-osmolality and hyponatremia: Secondary | ICD-10-CM

## 2017-09-26 DIAGNOSIS — Z79899 Other long term (current) drug therapy: Secondary | ICD-10-CM | POA: Insufficient documentation

## 2017-09-26 DIAGNOSIS — C50912 Malignant neoplasm of unspecified site of left female breast: Secondary | ICD-10-CM

## 2017-09-26 DIAGNOSIS — K219 Gastro-esophageal reflux disease without esophagitis: Secondary | ICD-10-CM

## 2017-09-26 DIAGNOSIS — Z7984 Long term (current) use of oral hypoglycemic drugs: Secondary | ICD-10-CM | POA: Diagnosis not present

## 2017-09-26 DIAGNOSIS — E119 Type 2 diabetes mellitus without complications: Secondary | ICD-10-CM | POA: Insufficient documentation

## 2017-09-26 DIAGNOSIS — Z170 Estrogen receptor positive status [ER+]: Principal | ICD-10-CM

## 2017-09-26 LAB — BASIC METABOLIC PANEL
Anion gap: 9 (ref 5–15)
BUN: 29 mg/dL — ABNORMAL HIGH (ref 6–20)
CO2: 26 mmol/L (ref 22–32)
Calcium: 9.4 mg/dL (ref 8.9–10.3)
Chloride: 93 mmol/L — ABNORMAL LOW (ref 101–111)
Creatinine, Ser: 1.13 mg/dL — ABNORMAL HIGH (ref 0.44–1.00)
GFR calc Af Amer: 57 mL/min — ABNORMAL LOW (ref >60)
GFR calc non Af Amer: 49 mL/min — ABNORMAL LOW (ref >60)
Glucose, Bld: 111 mg/dL — ABNORMAL HIGH (ref 65–99)
Potassium: 4.3 mmol/L (ref 3.5–5.1)
Sodium: 128 mmol/L — ABNORMAL LOW (ref 135–145)

## 2017-09-26 MED ORDER — DENOSUMAB 60 MG/ML ~~LOC~~ SOLN
60.0000 mg | Freq: Once | SUBCUTANEOUS | Status: AC
  Administered 2017-09-26: 60 mg via SUBCUTANEOUS
  Filled 2017-09-26: qty 1

## 2017-09-26 MED ORDER — DENOSUMAB 60 MG/ML ~~LOC~~ SOLN: 60.0000 mg | Freq: Once | SUBCUTANEOUS | 0 refills | Status: AC

## 2017-09-26 NOTE — Progress Notes (Signed)
[facility] day:  09/26/17  Chief Complaint: Amanda Downs is a 67 y.o. female with stage IC left breast cancer who is seen for 6 month assessment and continuation of twice yearly Prolia.  HPI:  The patient was last seen in the medical oncology clinic on 03/28/2017.  At that time, she was doing well.  She denied any breast concerns.  Exam revealed post-operative changes.  Sodium was 129.  CA27.29 was 16.1.  She received Prolia.  Bilateral mammogram at [facility] on 01/26/2017 revealed no evidence of malignancy.  Annual bilateral diagnostic mammography was recommended.  During the interim, patient is doing well. She denies any acute symptoms. Patient does not verbalize any breast concerns. Patient notes that she recently had labs with Dr. Vickki Muff. Her sodium had improved to "around 133".   Patient denies B symptoms and interval infections. She is eating well. Her weight is up 7 pounds. Patient denies pain in the clinic today.    Past Medical History:  Diagnosis Date  . Bariatric surgery status   . Cancer (HCC)    BREAST  . CHF (congestive heart failure) ([place])   . Diabetes mellitus without complication ([place])   . GERD (gastroesophageal reflux disease)   . Hyperlipemia   . Hypertension   . Mitral valve disorder   . Postsurgical malabsorption     Past Surgical History:  Procedure Laterality Date  . ABDOMINAL HYSTERECTOMY    . BREAST SURGERY Left   . COLONOSCOPY WITH PROPOFOL N/A 03/17/2016   Procedure: COLONOSCOPY WITH PROPOFOL;  Surgeon: Manya Silvas, MD;  Location: [facility] ENDOSCOPY;  Service: Endoscopy;  Laterality: N/A;  . HERNIA REPAIR    . ROUX-EN-Y GASTRIC BYPASS    . TONSILLECTOMY    . TUBAL LIGATION      Family History  Problem Relation Age of Onset  . Colon cancer Mother   . Stomach cancer Mother     Social History:  reports that she is a non-smoker but has been exposed to tobacco smoke. she has never used  smokeless tobacco. She reports that she does not drink alcohol or use drugs.  Thre patient lives in [place].  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  Allergies:  Allergies  Allergen Reactions  . Lipitor [Atorvastatin] Other (See Comments)  . Rosuvastatin     Other reaction(s): Muscle Pain  . Simvastatin Other (See Comments)    Current Medications: Current Outpatient Medications  Medication Sig Dispense Refill  . carvedilol (COREG) 6.25 MG tablet Take 6.25 mg by mouth 2 (two) times daily.    . cyanocobalamin ([employer] VITAMIN B12) 2000 MCG tablet Take by mouth as needed.     Marland Kitchen esomeprazole (NEXIUM 24HR) 20 MG capsule take 1 capsule as needed    . ezetimibe (ZETIA) 10 MG tablet Take 10 mg by mouth daily.     . furosemide (LASIX) 40 MG tablet Take 40 mg by mouth daily.     Marland Kitchen levocetirizine (XYZAL) 5 MG tablet Take by mouth.    . magnesium oxide (MAG-OX) 400 MG tablet Take 400 mg by mouth daily.     . metFORMIN (GLUCOPHAGE) 500 MG tablet Take 500 mg by mouth 2 (two) times daily with a meal.     . Omega-3 Fatty Acids (FISH OIL) 1000 MG CAPS Take by mouth.    . polyethylene glycol powder (GLYCOLAX/MIRALAX) powder Take as directed for colonoscopy prep.    Marland Kitchen  ramipril (ALTACE) 5 MG capsule Take 5 mg by mouth 2 (two) times daily.     Marland Kitchen saccharomyces boulardii (FLORASTOR) 250 MG capsule Take 250 mg by mouth daily.     Marland Kitchen spironolactone (ALDACTONE) 25 MG tablet Take 25 mg by mouth daily.     Marland Kitchen triamcinolone (NASACORT) 55 MCG/ACT AERO nasal inhaler Place into the nose.    . Vitamin D, Cholecalciferol, 400 UNITS CAPS Take by mouth.     No current facility-administered medications for this visit.     Review of Systems:  GENERAL:  Feels good.  No fevers or sweats.  Weight up 7 pounds since last visit PERFORMANCE STATUS (ECOG):  0 HEENT:  No visual changes, sore throat, mouth sores or tenderness. Lungs: No shortness of breath or cough.  No hemoptysis. Cardiac:  No chest  pain, palpitations, orthopnea, or PND.  History of CHF (sees Dr. Nehemiah Massed). GI:  No nausea, vomiting, diarrhea, constipation, melena or hematochezia. GU:  No urgency, frequency, dysuria, or hematuria. Musculoskeletal:  No back pain.  No joint pain.  No muscle tenderness. Extremities:  No pain or swelling. Skin:  No rashes or skin changes. Neuro:  No headache, numbness or weakness, balance or coordination issues. Endocrine:  No diabetes.  Thyroid issue (unsure).  No hot flashes or night sweats. Psych:  No mood changes, depression or anxiety. Pain:  No focal pain. Review of systems:  All other systems reviewed and found to be negative.  Physical Exam: Blood pressure 119/75, pulse 76, temperature 97.6 F (36.4 C), temperature source Tympanic, weight 155 lb 1.6 oz (70.4 kg). GENERAL:  Well developed, well nourished, woman sitting comfortably in the exam room in no acute distress. MENTAL STATUS:  Alert and oriented to person, place and time. HEAD:  Shoulder length frosted hair.  Normocephalic, atraumatic, face symmetric, no Cushingoid features. EYES:  Hazel eyes.  Pupils equal round and reactive to light and accomodation.  No conjunctivitis or scleral icterus. ENT:  Oropharynx clear without lesion.  Tongue normal. Mucous membranes moist.  RESPIRATORY:  Clear to auscultation without rales, wheezes or rhonchi. CARDIOVASCULAR:  Regular rate and rhythm without murmur, rub or gallop. BREAST:  Right breast without masses, skin changes or nipple discharge.  Left breast with scarring at the 5 o'clock position. Radiation changes (mild inferior edema).  No skin changes or nipple discharge.  ABDOMEN:  Soft, non-tender, with active bowel sounds, and no hepatosplenomegaly.  No masses. SKIN:  No rashes, ulcers or lesions. EXTREMITIES: No edema, no skin discoloration or tenderness.  No palpable cords. LYMPH NODES: No palpable cervical, supraclavicular, axillary or inguinal adenopathy  NEUROLOGICAL:  Unremarkable. PSYCH:  Appropriate.   Appointment on 09/26/2017  Component Date Value Ref Range Status  . Sodium 09/26/2017 128* 135 - 145 mmol/L Final  . Potassium 09/26/2017 4.3  3.5 - 5.1 mmol/L Final  . Chloride 09/26/2017 93* 101 - 111 mmol/L Final  . CO2 09/26/2017 26  22 - 32 mmol/L Final  . Glucose, Bld 09/26/2017 111* 65 - 99 mg/dL Final  . BUN 09/26/2017 29* 6 - 20 mg/dL Final  . Creatinine, Ser 09/26/2017 1.13* 0.44 - 1.00 mg/dL Final  . Calcium 09/26/2017 9.4  8.9 - 10.3 mg/dL Final  . GFR calc non Af Amer 09/26/2017 49* >60 mL/min Final  . GFR calc Af Amer 09/26/2017 57* >60 mL/min Final   Comment: (NOTE) The eGFR has been calculated using the CKD EPI equation. This calculation has not been validated in all clinical situations. eGFR's persistently <  60 mL/min signify possible Chronic Kidney Disease.   Georgiann Hahn gap 09/26/2017 9  5 - 15 Final   Performed at [facility], [address] [place], [place] [address]    Assessment:  Amanda Downs is a 67 y.o. female with stage IC left breast cancer s/p left partial mastectomy on 06/08/2010.  Pathology revealed a 1.1 cm grade I invasive carcinoma.  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  The more superficial smaller carcinoma extended to the inked margin at the caudal aspect in an area of 3 mm in linear extent.  One sentinel lymph node was negative.  DCIS was present.  She underwent re-excision for a positive margin.  Tumor was ER+, PR+, Her2/neu -.  Pathologic stage was T1cN0M0.  She received radiation from 07/06/2010 - 08/19/2010.  She was on Arimidex from 09/17/2010 - 03/19/2016.  Bilateral mammogram on 12/16/2015 revealed no evidence of malignancy.  Bilateral mammogram at [facility] on 01/26/2017 revealed no evidence of malignancy.   CA27.29 has been followed:  24.1 on 02/18/2011, 20.9 on 09/09/2011, 24.7 on 05/11/2012, 21.7 on 06/19/2013, 16.4 on 02/20/2016, and 16.1 on  03/28/2017.  BCI testing on 03/02/2016 revealed a 3.6% risk of late recurrence between years 5 through 10.  The likelihood of benefit of extended endocrine therapy is low.  Bone density study on 03/15/2016 revealed osteoporosis with a T score of -2.7 in the left hip.  She began Prolia on 03/30/2016 (last 09/24/2016).  She has chronic hyponatremia.  She is on spironolactone and Lasix.  Symptomatically, she is doing well.  She denies any breast concerns.  Exam reveals post-operative changes.  Sodium is 128.  Plan: 1.  Labs today:  BMP. 2.  Prolia today. 3.  Schedule bilateral mammogram at [facility] on 01/26/2018. 4.  Schedule bone density 03/15/2018. 5.  Discuss persistent HYPOnatremia. Will send results to Dr. Vickki Muff for review.  6.  RTC in 6 months for MD assessment,  labs (CBC with diff, CMP, CA27.29), and Prolia.   Honor Loh, NP  09/26/2017, 11:16 AM   I saw and evaluated the patient, participating in the key portions of the service and reviewing pertinent diagnostic studies and records.  I reviewed the nurse practitioner's note and agree with the findings and the plan.  The assessment and plan were discussed with the patient.  A few questions were asked by the patient and answered.   Lequita Asal, MD 09/26/2017,11:16 AM

## 2018-01-10 ENCOUNTER — Telehealth: Payer: Self-pay | Admitting: *Deleted

## 2018-01-10 NOTE — Telephone Encounter (Signed)
I will send a new order over today.

## 2018-01-10 NOTE — Telephone Encounter (Signed)
Patient states it is time for her mammogram and she needs order faxed to [facility] in [place] where she gets hers done. Please fax order to them, [phone]

## 2018-01-10 NOTE — Telephone Encounter (Signed)
  This was scheduled at last visit at [facility].  M

## 2018-01-10 NOTE — Telephone Encounter (Signed)
Patient reports [facility] does not have an order for it, Can order be refaxed?

## 2018-01-30 ENCOUNTER — Encounter: Payer: Self-pay | Admitting: Hematology and Oncology

## 2018-03-14 ENCOUNTER — Ambulatory Visit
Admission: RE | Admit: 2018-03-14 | Discharge: 2018-03-14 | Disposition: A | Discharge status: Home / Self Care | Payer: Medicare Other | Source: Ambulatory Visit | Attending: Urgent Care | Admitting: Urgent Care

## 2018-03-14 DIAGNOSIS — M810 Age-related osteoporosis without current pathological fracture: Secondary | ICD-10-CM | POA: Diagnosis not present

## 2018-03-30 ENCOUNTER — Inpatient Hospital Stay: Payer: Medicare Other

## 2018-03-30 ENCOUNTER — Inpatient Hospital Stay: Payer: Medicare Other | Attending: Hematology and Oncology

## 2018-03-30 ENCOUNTER — Inpatient Hospital Stay (HOSPITAL_BASED_OUTPATIENT_CLINIC_OR_DEPARTMENT_OTHER): Payer: Medicare Other | Admitting: Hematology and Oncology

## 2018-03-30 ENCOUNTER — Encounter: Payer: Self-pay | Admitting: Hematology and Oncology

## 2018-03-30 ENCOUNTER — Other Ambulatory Visit: Payer: Self-pay

## 2018-03-30 VITALS — BP 118/77 | HR 61 | Temp 97.3°F | Resp 18 | Ht 63.5 in | Wt 156.5 lb

## 2018-03-30 DIAGNOSIS — M810 Age-related osteoporosis without current pathological fracture: Secondary | ICD-10-CM | POA: Diagnosis not present

## 2018-03-30 DIAGNOSIS — Z853 Personal history of malignant neoplasm of breast: Secondary | ICD-10-CM | POA: Insufficient documentation

## 2018-03-30 DIAGNOSIS — C50912 Malignant neoplasm of unspecified site of left female breast: Secondary | ICD-10-CM

## 2018-03-30 DIAGNOSIS — Z170 Estrogen receptor positive status [ER+]: Secondary | ICD-10-CM

## 2018-03-30 LAB — CBC WITH DIFFERENTIAL/PLATELET
Basophils Absolute: 0.1 10*3/uL (ref 0–0.1)
Basophils Relative: 1 %
Eosinophils Absolute: 0.3 10*3/uL (ref 0–0.7)
Eosinophils Relative: 4 %
HCT: 33.9 % — ABNORMAL LOW (ref 35.0–47.0)
Hemoglobin: 11.1 g/dL — ABNORMAL LOW (ref 12.0–16.0)
Lymphocytes Relative: 15 %
Lymphs Abs: 0.9 10*3/uL — ABNORMAL LOW (ref 1.0–3.6)
MCH: 27.8 pg (ref 26.0–34.0)
MCHC: 32.7 g/dL (ref 32.0–36.0)
MCV: 85 fL (ref 80.0–100.0)
Monocytes Absolute: 0.7 10*3/uL (ref 0.2–0.9)
Monocytes Relative: 12 %
Neutro Abs: 4 10*3/uL (ref 1.4–6.5)
Neutrophils Relative %: 68 %
Platelets: 350 10*3/uL (ref 150–440)
RBC: 3.99 MIL/uL (ref 3.80–5.20)
RDW: 14.3 % (ref 11.5–14.5)
WBC: 6 10*3/uL (ref 3.6–11.0)

## 2018-03-30 LAB — COMPREHENSIVE METABOLIC PANEL
ALT: 17 U/L (ref 0–44)
AST: 25 U/L (ref 15–41)
Albumin: 4.1 g/dL (ref 3.5–5.0)
Alkaline Phosphatase: 42 U/L (ref 38–126)
Anion gap: 8 (ref 5–15)
BUN: 15 mg/dL (ref 8–23)
CO2: 25 mmol/L (ref 22–32)
Calcium: 9.2 mg/dL (ref 8.9–10.3)
Chloride: 96 mmol/L — ABNORMAL LOW (ref 98–111)
Creatinine, Ser: 0.98 mg/dL (ref 0.44–1.00)
GFR calc Af Amer: >60 mL/min (ref >60)
GFR calc non Af Amer: 58 mL/min — ABNORMAL LOW (ref >60)
Glucose, Bld: 105 mg/dL — ABNORMAL HIGH (ref 70–99)
Potassium: 4.5 mmol/L (ref 3.5–5.1)
Sodium: 129 mmol/L — ABNORMAL LOW (ref 135–145)
Total Bilirubin: 0.8 mg/dL (ref 0.3–1.2)
Total Protein: 6.9 g/dL (ref 6.5–8.1)

## 2018-03-30 MED ORDER — DENOSUMAB 60 MG/ML ~~LOC~~ SOSY
60.0000 mg | PREFILLED_SYRINGE | Freq: Once | SUBCUTANEOUS | Status: AC
  Administered 2018-03-30: 60 mg via SUBCUTANEOUS
  Filled 2018-03-30: qty 1

## 2018-03-30 NOTE — Progress Notes (Signed)
[facility] day:  03/30/18  Chief Complaint: Amanda Downs is a 67 y.o. female with stage IC left breast cancer who is seen for 6 month assessment and continuation of twice yearly Prolia.  HPI:  The patient was last seen in the medical oncology clinic on 09/26/2017.  At that time, she was doing well.  She denied any breast concerns.  Exam revealed post-operative changes.  Sodium was 128. She received Prolia.  Bilateral mammogram at [facility] on 01/26/2018 revealed no evidence of malignancy.   Bone density on 03/14/2018 revealed osteopenia with a T-score of -2.0 in the right femur.  During the interim, patient is doing well. She denies any acute complaints. Patient states that she is felling generally well. Patient denies that she has experienced any B symptoms. She denies any interval infections.  Patient does not verbalize any concerns with regards to her breasts today. Patient performs monthly self breast examinations as recommended.   Patient advises that she maintains an adequate appetite. She is eating well. Weight today is 156 lb 8 oz (71 kg), which compared to her last visit to the clinic, represents a 1 pound increase.    Patient denies pain in the clinic today.   Past Medical History:  Diagnosis Date  . Bariatric surgery status   . Cancer (HCC)    BREAST  . CHF (congestive heart failure) ([place])   . Diabetes mellitus without complication ([place])   . GERD (gastroesophageal reflux disease)   . Hyperlipemia   . Hypertension   . Mitral valve disorder   . Postsurgical malabsorption     Past Surgical History:  Procedure Laterality Date  . ABDOMINAL HYSTERECTOMY    . BREAST SURGERY Left   . COLONOSCOPY WITH PROPOFOL N/A 03/17/2016   Procedure: COLONOSCOPY WITH PROPOFOL;  Surgeon: Manya Silvas, MD;  Location: [facility] ENDOSCOPY;  Service: Endoscopy;  Laterality: N/A;  . HERNIA REPAIR    . ROUX-EN-Y GASTRIC BYPASS    .  TONSILLECTOMY    . TUBAL LIGATION      Family History  Problem Relation Age of Onset  . Colon cancer Mother   . Stomach cancer Mother     Social History:  reports that she is a non-smoker but has been exposed to tobacco smoke. She has never used smokeless tobacco. She reports that she does not drink alcohol or use drugs.  Thre patient lives in [place].  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  Allergies:  Allergies  Allergen Reactions  . Lipitor [Atorvastatin] Other (See Comments)  . Rosuvastatin     Other reaction(s): Muscle Pain  . Simvastatin Other (See Comments)    Current Medications: Current Outpatient Medications  Medication Sig Dispense Refill  . carvedilol (COREG) 6.25 MG tablet Take 6.25 mg by mouth 2 (two) times daily.    . cyanocobalamin ([employer] VITAMIN B12) 2000 MCG tablet Take by mouth as needed.     . ezetimibe (ZETIA) 10 MG tablet Take 10 mg by mouth daily.     . furosemide (LASIX) 40 MG tablet Take 40 mg by mouth daily.     Marland Kitchen levocetirizine (XYZAL) 5 MG tablet Take by mouth.    . magnesium oxide (MAG-OX) 400 MG tablet Take 400 mg by mouth daily.     . metFORMIN (GLUCOPHAGE) 500 MG tablet Take 500 mg by mouth 2 (two) times daily with a meal.     .  Omega-3 Fatty Acids (FISH OIL) 1000 MG CAPS Take by mouth.    . ramipril (ALTACE) 5 MG capsule Take 5 mg by mouth 2 (two) times daily.     Marland Kitchen saccharomyces boulardii (FLORASTOR) 250 MG capsule Take 250 mg by mouth daily.     Marland Kitchen spironolactone (ALDACTONE) 25 MG tablet Take 25 mg by mouth daily.     . Vitamin D, Cholecalciferol, 400 UNITS CAPS Take by mouth.    . esomeprazole (NEXIUM 24HR) 20 MG capsule take 1 capsule as needed    . polyethylene glycol powder (GLYCOLAX/MIRALAX) powder Take as directed for colonoscopy prep.    . triamcinolone (NASACORT) 55 MCG/ACT AERO nasal inhaler Place into the nose.     No current facility-administered medications for this visit.     Review of Systems:   GENERAL:  Feels good.  No fevers, sweats or weight loss. Weight up 1 pound. PERFORMANCE STATUS (ECOG):  0 HEENT:  No visual changes, runny nose, sore throat, mouth sores or tenderness. Lungs: No shortness of breath or cough.  No hemoptysis. Cardiac:  No chest pain, palpitations, orthopnea, or PND. H/o CHF (sees Dr. Nehemiah Massed). GI:  Constipation.  No nausea, vomiting, diarrhea, melena or hematochezia. GU:  No urgency, frequency, dysuria, or hematuria. Musculoskeletal:  No back pain.  No joint pain.  No muscle tenderness. Extremities:  No pain or swelling. Skin:  No rashes or skin changes. Neuro:  No headache, numbness or weakness, balance or coordination issues. Endocrine:  No diabetes, thyroid issues, hot flashes or night sweats. Psych:  No mood changes, depression or anxiety. Pain:  No focal pain. Review of systems:  All other systems reviewed and found to be negative.   Physical Exam: Blood pressure 118/77, pulse 61, temperature (!) 97.3 F (36.3 C), temperature source Tympanic, resp. rate 18, height 5' 3.5" (1.613 m), weight 156 lb 8 oz (71 kg), SpO2 100 %. GENERAL:  Well developed, well nourished, woman sitting comfortably in the exam room in no acute distress. MENTAL STATUS:  Alert and oriented to person, place and time. HEAD:  Frosted hair.  Normocephalic, atraumatic, face symmetric, no Cushingoid features. EYES:  Hazel eyes.  Pupils equal round and reactive to light and accomodation.  No conjunctivitis or scleral icterus. ENT:  Oropharynx clear without lesion.  Tongue normal. Mucous membranes moist.  RESPIRATORY:  Clear to auscultation without rales, wheezes or rhonchi. CARDIOVASCULAR:  Regular rate and rhythm without murmur, rub or gallop. BREAST:  Right breast without masses, skin changes or nipple discharge.  Left breast with post-operative and post-radiation changes (inferior edema).  No masses, skin changes or nipple discharge.  Fibrocystic changes. ABDOMEN:  Soft, non-tender,  with active bowel sounds, and no hepatosplenomegaly.  No masses. SKIN:  No rashes, ulcers or lesions. EXTREMITIES: No edema, no skin discoloration or tenderness.  No palpable cords. LYMPH NODES: No palpable cervical, supraclavicular, axillary or inguinal adenopathy  NEUROLOGICAL: Unremarkable. PSYCH:  Appropriate.    Orders Only on 03/30/2018  Component Date Value Ref Range Status  . Sodium 03/30/2018 129* 135 - 145 mmol/L Final  . Potassium 03/30/2018 4.5  3.5 - 5.1 mmol/L Final  . Chloride 03/30/2018 96* 98 - 111 mmol/L Final  . CO2 03/30/2018 25  22 - 32 mmol/L Final  . Glucose, Bld 03/30/2018 105* 70 - 99 mg/dL Final  . BUN 03/30/2018 15  8 - 23 mg/dL Final  . Creatinine, Ser 03/30/2018 0.98  0.44 - 1.00 mg/dL Final  . Calcium 03/30/2018 9.2  8.9 -  10.3 mg/dL Final  . Total Protein 03/30/2018 6.9  6.5 - 8.1 g/dL Final  . Albumin 03/30/2018 4.1  3.5 - 5.0 g/dL Final  . AST 03/30/2018 25  15 - 41 U/L Final  . ALT 03/30/2018 17  0 - 44 U/L Final  . Alkaline Phosphatase 03/30/2018 42  38 - 126 U/L Final  . Total Bilirubin 03/30/2018 0.8  0.3 - 1.2 mg/dL Final  . GFR calc non Af Amer 03/30/2018 58* >60 mL/min Final  . GFR calc Af Amer 03/30/2018 >60  >60 mL/min Final   Comment: (NOTE) The eGFR has been calculated using the CKD EPI equation. This calculation has not been validated in all clinical situations. eGFR's persistently <60 mL/min signify possible Chronic Kidney Disease.   Georgiann Hahn gap 03/30/2018 8  5 - 15 Final   Performed at [facility], [address] [place], [place] [address]  . WBC 03/30/2018 6.0  3.6 - 11.0 K/uL Final  . RBC 03/30/2018 3.99  3.80 - 5.20 MIL/uL Final  . Hemoglobin 03/30/2018 11.1* 12.0 - 16.0 g/dL Final  . HCT 03/30/2018 33.9* 35.0 - 47.0 % Final  . MCV 03/30/2018 85.0  80.0 - 100.0 fL Final  . MCH 03/30/2018 27.8  26.0 - 34.0 pg Final  . MCHC 03/30/2018 32.7  32.0 - 36.0 g/dL Final  . RDW 03/30/2018 14.3  11.5 - 14.5 % Final  . Platelets  03/30/2018 350  150 - 440 K/uL Final  . Neutrophils Relative % 03/30/2018 68  % Final  . Neutro Abs 03/30/2018 4.0  1.4 - 6.5 K/uL Final  . Lymphocytes Relative 03/30/2018 15  % Final  . Lymphs Abs 03/30/2018 0.9* 1.0 - 3.6 K/uL Final  . Monocytes Relative 03/30/2018 12  % Final  . Monocytes Absolute 03/30/2018 0.7  0.2 - 0.9 K/uL Final  . Eosinophils Relative 03/30/2018 4  % Final  . Eosinophils Absolute 03/30/2018 0.3  0 - 0.7 K/uL Final  . Basophils Relative 03/30/2018 1  % Final  . Basophils Absolute 03/30/2018 0.1  0 - 0.1 K/uL Final   Performed at [facility], [address] [place], [place] [address]    Assessment:  Amanda Downs is a 67 y.o. female with stage IC left breast cancer s/p left partial mastectomy on 06/08/2010.  Pathology revealed a 1.1 cm grade I invasive carcinoma.  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  The more superficial smaller carcinoma extended to the inked margin at the caudal aspect in an area of 3 mm in linear extent.  One sentinel lymph node was negative.  DCIS was present.  She underwent re-excision for a positive margin.  Tumor was ER+, PR+, Her2/neu -.  Pathologic stage was [id number].  She received radiation from 07/06/2010 - 08/19/2010.  She was on Arimidex from 09/17/2010 - 03/19/2016.  Bilateral mammogram on 12/16/2015 revealed no evidence of malignancy.  Bilateral mammogram at [facility] on 01/26/2017 revealed no evidence of malignancy.  Bilateral mammogram at [facility] on 01/26/2018 revealed no evidence of malignancy.   CA27.29 has been followed:  24.1 on 02/18/2011, 20.9 on 09/09/2011, 24.7 on 05/11/2012, 21.7 on 06/19/2013, 16.4 on 02/20/2016, 16.1 on 03/28/2017, and 13.4 on 03/30/2018.  BCI testing on 03/02/2016 revealed a 3.6% risk of late recurrence between years 5 through 10.  The likelihood of benefit of extended endocrine therapy is low.  Bone density on 03/15/2016 revealed osteoporosis with  a T score of -2.7  in the left hip.  Bone density on 03/14/2018 revealed osteopenia with a T-score of -2.0 in the right femur.  She began Prolia on 03/30/2016 (last 09/26/2017).  She has chronic hyponatremia.  She is on spironolactone and Lasix.  Symptomatically, she denies any concerns.  Exam is stable.  She has chronic hyponatremia.  Sodium is 129.  Plan: 1.  Labs today: CBC with diff, CMP, CA27.29. 2.  Stage IC left breast cancer:  Obtain copy of [facility] mammogram.  No evidence of recurrent disease. 3.  Osteopenia:  Improvement of bone density from osteoporosis to osteopenia.  Continue Prolia (due today). 4.  Hyponatremia:  Chronic.  Patient to follow-up with Dr. Vickki Muff. 5.  RTC in 6 months for MD assessment, labs (CBC with diff, CMP, CA27.29), and Prolia.   Honor Loh, NP  03/30/2018, 11:28 AM   I saw and evaluated the patient, participating in the key portions of the service and reviewing pertinent diagnostic studies and records.  I reviewed the nurse practitioner's note and agree with the findings and the plan.  The assessment and plan were discussed with the patient.  A few questions were asked by the patient and answered.   Lequita Asal, MD 03/30/2018,11:28 AM

## 2018-03-30 NOTE — Progress Notes (Signed)
No new changes noted today 

## 2018-03-31 LAB — CA 27.29 (SERIAL MONITOR): CA 27.29: 13.4 U/mL (ref 0.0–38.6)

## 2018-09-28 ENCOUNTER — Ambulatory Visit: Payer: Medicare Other | Admitting: Hematology and Oncology

## 2018-09-28 ENCOUNTER — Other Ambulatory Visit: Payer: Medicare Other

## 2018-09-28 ENCOUNTER — Ambulatory Visit: Payer: Medicare Other

## 2018-10-04 ENCOUNTER — Ambulatory Visit: Payer: Medicare Other

## 2018-10-04 ENCOUNTER — Other Ambulatory Visit: Payer: Medicare Other

## 2018-10-04 ENCOUNTER — Ambulatory Visit: Payer: Medicare Other | Admitting: Urgent Care

## 2018-11-01 ENCOUNTER — Ambulatory Visit: Payer: Medicare Other | Admitting: Hematology and Oncology

## 2018-11-01 ENCOUNTER — Ambulatory Visit: Payer: Medicare Other

## 2018-11-01 ENCOUNTER — Other Ambulatory Visit: Payer: Medicare Other

## 2018-11-27 NOTE — Progress Notes (Signed)
[facility]  [address], [address] [place], [place] [address] Phone: [phone]  Fax: [phone]   Clinic Day:  11/28/2018  Referring physician: Clarisse Gouge, MD  Chief Complaint: Amanda Downs is a 68 y.o. female with stage IC left breast cancer who is seen for 8 month assessment and continuation of twice yearly Prolia.  HPI: The patient was last seen in the medical oncology clinic on 03/30/2018. At that time, she denied any concerns. Exam was stable. She had chronic hyponatremia. Sodium was 129. CA 27.29 was 13.4. She received Prolia.  She is followed by Dr. Nehemiah Massed, cardiology. ECHO on 09/14/2018 showed an ejection fraction of 45%. She continued on spironolactone and Lasix.  During the interim, she has been doing well. She denies any breast concerns.  She performs monthly breast exams. She no longer sees her Psychologist, sport and exercise.  She still has issues with constipation  She occasionally props her head up in bed to ease her acid reflux. She has gained 8lbs since her last clinic visit. She reports she is not getting as much exercise due to the pandemic.   She denies any bleeding. She eats 8-10 servings of meat per week. She eats green leafy vegetables and salads occasionally. She has started taking a multivitamin with an iron supplement.   Past Medical History:  Diagnosis Date   Bariatric surgery status    Cancer ([place])    BREAST   CHF (congestive heart failure) (HCC)    Diabetes mellitus without complication (HCC)    GERD (gastroesophageal reflux disease)    Hyperlipemia    Hypertension    Mitral valve disorder    Postsurgical malabsorption     Past Surgical History:  Procedure Laterality Date   ABDOMINAL HYSTERECTOMY     BREAST SURGERY Left    COLONOSCOPY WITH PROPOFOL N/A 03/17/2016   Procedure: COLONOSCOPY WITH PROPOFOL;  Surgeon: Manya Silvas, MD;  Location: [facility];  Service: Endoscopy;  Laterality: N/A;   HERNIA REPAIR      ROUX-EN-Y GASTRIC BYPASS     TONSILLECTOMY     TUBAL LIGATION      Family History  Problem Relation Age of Onset   Colon cancer Mother    Stomach cancer Mother     Social History:  reports that she is a non-smoker but has been exposed to tobacco smoke. She has never used smokeless tobacco. She reports that she does not drink alcohol or use drugs. The patient lives in [place].  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  Allergies:  Allergies  Allergen Reactions   Lipitor [Atorvastatin] Other (See Comments)   Rosuvastatin     Other reaction(s): Muscle Pain   Simvastatin Other (See Comments)    Current Medications: Current Outpatient Medications  Medication Sig Dispense Refill   carvedilol (COREG) 6.25 MG tablet Take 6.25 mg by mouth 2 (two) times daily.     cyanocobalamin ([employer] VITAMIN B12) 2000 MCG tablet Take by mouth as needed.      esomeprazole (NEXIUM 24HR) 20 MG capsule take 1 capsule as needed     ezetimibe (ZETIA) 10 MG tablet Take 10 mg by mouth daily.      furosemide (LASIX) 40 MG tablet Take 40 mg by mouth daily.      levocetirizine (XYZAL) 5 MG tablet Take 5 mg by mouth every evening.      lubiprostone (AMITIZA) 24 MCG capsule Take 24 mcg by mouth as  needed.      magnesium oxide (MAG-OX) 400 MG tablet Take 400 mg by mouth daily.      Melatonin 3 MG TABS Take 1 tablet by mouth at bedtime.      metFORMIN (GLUCOPHAGE) 500 MG tablet Take 500 mg by mouth 2 (two) times daily with a meal.      Omega-3 Fatty Acids (FISH OIL) 1000 MG CAPS Take 1 capsule by mouth as needed.      ramipril (ALTACE) 5 MG capsule Take 5 mg by mouth 2 (two) times daily.      saccharomyces boulardii (FLORASTOR) 250 MG capsule Take 250 mg by mouth daily.      spironolactone (ALDACTONE) 25 MG tablet Take 25 mg by mouth daily.      triamcinolone (NASACORT) 55 MCG/ACT AERO nasal inhaler Place 1 spray into the nose daily.      Vitamin D,  Cholecalciferol, 400 UNITS CAPS Take 1 capsule by mouth daily.      No current facility-administered medications for this visit.     Review of Systems  Constitutional: Negative.  Negative for chills, diaphoresis, fever, malaise/fatigue and weight loss (up 8 pounds).  HENT: Negative.  Negative for congestion, ear pain, hearing loss, nosebleeds and sore throat.   Eyes: Negative.  Negative for blurred vision, double vision and photophobia.  Respiratory: Negative.  Negative for cough, hemoptysis, sputum production, shortness of breath and wheezing.   Cardiovascular: Negative for chest pain, palpitations, orthopnea and leg swelling.       H/o CHF (sees Dr. Nehemiah Massed).  EF 45% on 09/14/2018.  Gastrointestinal: Positive for constipation and heartburn (occasionally). Negative for abdominal pain, diarrhea, nausea and vomiting.  Genitourinary: Negative.  Negative for dysuria, frequency, hematuria and urgency.  Musculoskeletal: Negative.  Negative for joint pain and myalgias.  Skin: Negative.  Negative for itching and rash.  Neurological: Negative.  Negative for dizziness, tingling, sensory change, weakness and headaches.  Endo/Heme/Allergies: Negative.  Does not bruise/bleed easily.  Psychiatric/Behavioral: Negative.  Negative for depression and memory loss. The patient is not nervous/anxious and does not have insomnia.   All other systems reviewed and are negative.  Performance status (ECOG):  0  Physical Exam  Constitutional: She is oriented to person, place, and time. She appears well-developed and well-nourished. No distress.  HENT:  Head: Normocephalic and atraumatic.  Mouth/Throat: Oropharynx is clear and moist.  Frosted hair. Wearing mask and gloves.  Eyes: Pupils are equal, round, and reactive to light. Conjunctivae and EOM are normal. No scleral icterus.  Hazel eyes.   Neck: Normal range of motion. Neck supple.  Cardiovascular: Normal rate, regular rhythm and normal heart sounds. Exam  reveals no gallop and no friction rub.  No murmur heard. Pulmonary/Chest: Effort normal and breath sounds normal. No respiratory distress. She has no wheezes. Right breast exhibits no mass, no nipple discharge and no skin change. Left breast exhibits skin change (post-operative and post-radiation changes with inferior edema). Left breast exhibits no mass and no nipple discharge.  Abdominal: Soft. Bowel sounds are normal. She exhibits no distension. There is no abdominal tenderness.  Musculoskeletal: Normal range of motion.        General: No edema.  Lymphadenopathy:    She has no cervical adenopathy.    She has no axillary adenopathy.       Right: No supraclavicular adenopathy present.       Left: No supraclavicular adenopathy present.  Neurological: She is alert and oriented to person, place, and time.  Skin:  Skin is warm and dry. No rash noted. She is not diaphoretic. No erythema.  Psychiatric: She has a normal mood and affect. Her behavior is normal. Judgment and thought content normal.  Nursing note and vitals reviewed.    Appointment on 11/28/2018  Component Date Value Ref Range Status   Sodium 11/28/2018 129* 135 - 145 mmol/L Final   Potassium 11/28/2018 4.8  3.5 - 5.1 mmol/L Final   Chloride 11/28/2018 96* 98 - 111 mmol/L Final   CO2 11/28/2018 25  22 - 32 mmol/L Final   Glucose, Bld 11/28/2018 107* 70 - 99 mg/dL Final   BUN 11/28/2018 20  8 - 23 mg/dL Final   Creatinine, Ser 11/28/2018 1.10* 0.44 - 1.00 mg/dL Final   Calcium 11/28/2018 9.2  8.9 - 10.3 mg/dL Final   Total Protein 11/28/2018 7.0  6.5 - 8.1 g/dL Final   Albumin 11/28/2018 4.0  3.5 - 5.0 g/dL Final   AST 11/28/2018 32  15 - 41 U/L Final   ALT 11/28/2018 26  0 - 44 U/L Final   Alkaline Phosphatase 11/28/2018 55  38 - 126 U/L Final   Total Bilirubin 11/28/2018 0.4  0.3 - 1.2 mg/dL Final   GFR calc non Af Amer 11/28/2018 52* >60 mL/min Final   GFR calc Af Amer 11/28/2018 60* >60 mL/min Final    Anion gap 11/28/2018 8  5 - 15 Final   Performed at [facility], [address] [place], [place] [address]   WBC 11/28/2018 7.1  4.0 - 10.5 K/uL Final   RBC 11/28/2018 4.08  3.87 - 5.11 MIL/uL Final   Hemoglobin 11/28/2018 10.9* 12.0 - 15.0 g/dL Final   HCT 11/28/2018 33.3* 36.0 - 46.0 % Final   MCV 11/28/2018 81.6  80.0 - 100.0 fL Final   MCH 11/28/2018 26.7  26.0 - 34.0 pg Final   MCHC 11/28/2018 32.7  30.0 - 36.0 g/dL Final   RDW 11/28/2018 15.1  11.5 - 15.5 % Final   Platelets 11/28/2018 324  150 - 400 K/uL Final   nRBC 11/28/2018 0.0  0.0 - 0.2 % Final   Neutrophils Relative % 11/28/2018 68  % Final   Neutro Abs 11/28/2018 4.7  1.7 - 7.7 K/uL Final   Lymphocytes Relative 11/28/2018 18  % Final   Lymphs Abs 11/28/2018 1.3  0.7 - 4.0 K/uL Final   Monocytes Relative 11/28/2018 10  % Final   Monocytes Absolute 11/28/2018 0.7  0.1 - 1.0 K/uL Final   Eosinophils Relative 11/28/2018 4  % Final   Eosinophils Absolute 11/28/2018 0.3  0.0 - 0.5 K/uL Final   Basophils Relative 11/28/2018 0  % Final   Basophils Absolute 11/28/2018 0.0  0.0 - 0.1 K/uL Final   Immature Granulocytes 11/28/2018 0  % Final   Abs Immature Granulocytes 11/28/2018 0.03  0.00 - 0.07 K/uL Final   Performed at [facility], [address], [place], [place] [address]    Assessment:  Amanda Downs is a 68 y.o. female with stage IC left breast cancer s/p left partial mastectomy on 06/08/2010.  Pathology revealed a 1.1 cm grade I invasive carcinoma.  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  The more superficial smaller carcinoma extended to the inked margin at the caudal aspect in an area of 3 mm in linear extent.  One sentinel lymph node was negative.  DCIS was present.  She underwent re-excision for a positive margin.  Tumor was ER+, PR+, Her2/neu -.  Pathologic stage was [id number].  She received radiation from 07/06/2010 - 08/19/2010.  She  was on Arimidex from 09/17/2010 - 03/19/2016.  Bilateral mammogram on 12/16/2015 revealed no evidence of malignancy.  Bilateral mammogram at [facility] on 01/26/2017 revealed no evidence of malignancy.  Bilateral mammogram at [facility] on 01/26/2018 revealed no evidence of malignancy.   CA27.29 has been followed:  24.1 on 02/18/2011, 20.9 on 09/09/2011, 24.7 on 05/11/2012, 21.7 on 06/19/2013, 16.4 on 02/20/2016, 16.1 on 03/28/2017, 13.4 on 03/30/2018, and 27.9 on 11/28/2018.  BCI testing on 03/02/2016 revealed a 3.6% risk of late recurrence between years 5 through 10.  The likelihood of benefit of extended endocrine therapy is low.  Bone density on 03/15/2016 revealed osteoporosis with a T score of -2.7 in the left hip.  Bone density on 03/14/2018 revealed osteopenia with a T-score of -2.0 in the right femur.  She began Prolia on 03/30/2016 (last 03/30/2018).  She has iron deficiency anemia s/p gastric bypass surgery (07/11/2014).  Ferritin was 6 with an iron saturation of 9% and a TIBC of 485 on 09/26/2018.  Diet is good.  She denies any bleeding.  She is on oral iron.  Colonoscopy on 03/17/2016 revealed 2 diminutive polyps in the proximal ascending colon and in the cecum (tubular adenomas).  She has chronic hyponatremia.  She is on spironolactone and Lasix.  Symptomatically, she denies any breast concerns.  Exam is stable.  Hemoglobin is 10.9.  CA27.29 is normal.  Plan: 1.   Labs today: CBC with diff, CMP, CA27.29. 2.   Stage IC left breast cancer             Clinically, she is doing well.  Exam is stable.  Schedule bilateral mammogram on 01/31/2019. 3.   Osteopenia             Bone density has improved from osteoporosis to osteopenia with treatment.             Continue calcium and vitamin D.  Prolia today.  Continue Prolia every 6 months. 4.   Iron deficiency anemia  Patient is s/p gastric bypass surgery on 07/11/2014.  Colonoscopy on 03/17/2016 revealed 2 diminutive  polyps in the proximal ascending colon and in the cecum (tubular adenomas).  Hemoglobin 11.1 to 10.9 in past 8 months.  Labs on 09/26/2018:  Ferritin 6 with an iron saturation of 9% and a TIBC of 485   She started taking a MVI + iron.  Diet appears good (meat 8-10 x/week).    No history of melena, hematochezia or hematuria.  Etiology may be secondary to absorption issues s/p gastric surgery.    She may require IV iron in the future.  Patient notes follow-up with Dr. Vickki Muff on 12/13/2018 or 12/14/2018. 5.   Hyponatremia, chronic             Sodium 129.  Patient to follow-up with Dr. Vickki Muff. 6.  RTC in 6 months for MD assessment, labs (CBC with diff, CMP, CA27.29), review of mammogram, and Prolia.  I discussed the assessment and treatment plan with the patient.  The patient was provided an opportunity to ask questions and all were answered.  The patient agreed with the plan and demonstrated an understanding of the instructions.  The patient was advised to call back if the symptoms worsen or if the condition fails to improve as anticipated.   Lequita Asal, MD, PhD    11/28/2018, 10:10 AM  I,  Molly Dorshimer, am acting as Education administrator for [employer]. Amanda Gip, MD, PhD.  I, Carmyn Hamm C. Amanda Gip, MD, have reviewed the above documentation for accuracy and completeness, and I agree with the above.

## 2018-11-28 ENCOUNTER — Inpatient Hospital Stay: Payer: Medicare Other

## 2018-11-28 ENCOUNTER — Inpatient Hospital Stay: Payer: Medicare Other | Admitting: Hematology and Oncology

## 2018-11-28 ENCOUNTER — Other Ambulatory Visit: Payer: Self-pay

## 2018-11-28 ENCOUNTER — Inpatient Hospital Stay: Payer: Medicare Other | Attending: Hematology and Oncology

## 2018-11-28 ENCOUNTER — Encounter: Payer: Self-pay | Admitting: Hematology and Oncology

## 2018-11-28 VITALS — BP 111/73 | HR 63 | Temp 98.0°F | Resp 16 | Wt 164.2 lb

## 2018-11-28 DIAGNOSIS — I509 Heart failure, unspecified: Secondary | ICD-10-CM | POA: Diagnosis not present

## 2018-11-28 DIAGNOSIS — E119 Type 2 diabetes mellitus without complications: Secondary | ICD-10-CM

## 2018-11-28 DIAGNOSIS — D509 Iron deficiency anemia, unspecified: Secondary | ICD-10-CM | POA: Diagnosis not present

## 2018-11-28 DIAGNOSIS — Z79899 Other long term (current) drug therapy: Secondary | ICD-10-CM

## 2018-11-28 DIAGNOSIS — C50912 Malignant neoplasm of unspecified site of left female breast: Secondary | ICD-10-CM | POA: Insufficient documentation

## 2018-11-28 DIAGNOSIS — Z170 Estrogen receptor positive status [ER+]: Secondary | ICD-10-CM | POA: Diagnosis not present

## 2018-11-28 DIAGNOSIS — Z9884 Bariatric surgery status: Secondary | ICD-10-CM | POA: Insufficient documentation

## 2018-11-28 DIAGNOSIS — E871 Hypo-osmolality and hyponatremia: Secondary | ICD-10-CM | POA: Insufficient documentation

## 2018-11-28 DIAGNOSIS — M8580 Other specified disorders of bone density and structure, unspecified site: Secondary | ICD-10-CM | POA: Diagnosis not present

## 2018-11-28 DIAGNOSIS — Z800 Family history of malignant neoplasm of digestive organs: Secondary | ICD-10-CM | POA: Insufficient documentation

## 2018-11-28 DIAGNOSIS — D649 Anemia, unspecified: Secondary | ICD-10-CM

## 2018-11-28 DIAGNOSIS — K219 Gastro-esophageal reflux disease without esophagitis: Secondary | ICD-10-CM

## 2018-11-28 DIAGNOSIS — E785 Hyperlipidemia, unspecified: Secondary | ICD-10-CM | POA: Diagnosis not present

## 2018-11-28 DIAGNOSIS — Z7984 Long term (current) use of oral hypoglycemic drugs: Secondary | ICD-10-CM

## 2018-11-28 DIAGNOSIS — K912 Postsurgical malabsorption, not elsewhere classified: Secondary | ICD-10-CM

## 2018-11-28 DIAGNOSIS — M85851 Other specified disorders of bone density and structure, right thigh: Secondary | ICD-10-CM

## 2018-11-28 DIAGNOSIS — K5900 Constipation, unspecified: Secondary | ICD-10-CM

## 2018-11-28 DIAGNOSIS — I110 Hypertensive heart disease with heart failure: Secondary | ICD-10-CM | POA: Diagnosis not present

## 2018-11-28 LAB — CBC WITH DIFFERENTIAL/PLATELET
Abs Immature Granulocytes: 0.03 10*3/uL (ref 0.00–0.07)
Basophils Absolute: 0 10*3/uL (ref 0.0–0.1)
Basophils Relative: 0 %
Eosinophils Absolute: 0.3 10*3/uL (ref 0.0–0.5)
Eosinophils Relative: 4 %
HCT: 33.3 % — ABNORMAL LOW (ref 36.0–46.0)
Hemoglobin: 10.9 g/dL — ABNORMAL LOW (ref 12.0–15.0)
Immature Granulocytes: 0 %
Lymphocytes Relative: 18 %
Lymphs Abs: 1.3 10*3/uL (ref 0.7–4.0)
MCH: 26.7 pg (ref 26.0–34.0)
MCHC: 32.7 g/dL (ref 30.0–36.0)
MCV: 81.6 fL (ref 80.0–100.0)
Monocytes Absolute: 0.7 10*3/uL (ref 0.1–1.0)
Monocytes Relative: 10 %
Neutro Abs: 4.7 10*3/uL (ref 1.7–7.7)
Neutrophils Relative %: 68 %
Platelets: 324 10*3/uL (ref 150–400)
RBC: 4.08 MIL/uL (ref 3.87–5.11)
RDW: 15.1 % (ref 11.5–15.5)
WBC: 7.1 10*3/uL (ref 4.0–10.5)
nRBC: 0 % (ref 0.0–0.2)

## 2018-11-28 LAB — COMPREHENSIVE METABOLIC PANEL
ALT: 26 U/L (ref 0–44)
AST: 32 U/L (ref 15–41)
Albumin: 4 g/dL (ref 3.5–5.0)
Alkaline Phosphatase: 55 U/L (ref 38–126)
Anion gap: 8 (ref 5–15)
BUN: 20 mg/dL (ref 8–23)
CO2: 25 mmol/L (ref 22–32)
Calcium: 9.2 mg/dL (ref 8.9–10.3)
Chloride: 96 mmol/L — ABNORMAL LOW (ref 98–111)
Creatinine, Ser: 1.1 mg/dL — ABNORMAL HIGH (ref 0.44–1.00)
GFR calc Af Amer: 60 mL/min — ABNORMAL LOW (ref >60)
GFR calc non Af Amer: 52 mL/min — ABNORMAL LOW (ref >60)
Glucose, Bld: 107 mg/dL — ABNORMAL HIGH (ref 70–99)
Potassium: 4.8 mmol/L (ref 3.5–5.1)
Sodium: 129 mmol/L — ABNORMAL LOW (ref 135–145)
Total Bilirubin: 0.4 mg/dL (ref 0.3–1.2)
Total Protein: 7 g/dL (ref 6.5–8.1)

## 2018-11-28 MED ORDER — DENOSUMAB 60 MG/ML ~~LOC~~ SOSY
60.0000 mg | PREFILLED_SYRINGE | Freq: Once | SUBCUTANEOUS | Status: AC
  Administered 2018-11-28: 60 mg via SUBCUTANEOUS

## 2018-11-28 NOTE — Progress Notes (Signed)
Pt here for follow up. Denies any concerns.  

## 2018-11-28 NOTE — Patient Instructions (Signed)

## 2018-11-29 ENCOUNTER — Telehealth: Payer: Self-pay

## 2018-11-29 LAB — CANCER ANTIGEN 27.29: CA 27.29: 27.9 U/mL (ref 0.0–38.6)

## 2018-11-29 NOTE — Telephone Encounter (Signed)
Faxed orders for MM Diag Breast TOMO Bilateral to [facility] in [place] [phone]. Faxed conformation confirmed

## 2018-12-01 DIAGNOSIS — D649 Anemia, unspecified: Secondary | ICD-10-CM | POA: Insufficient documentation

## 2018-12-25 ENCOUNTER — Other Ambulatory Visit: Payer: Self-pay

## 2019-02-21 ENCOUNTER — Encounter: Payer: Self-pay | Admitting: Hematology and Oncology

## 2019-05-27 NOTE — Progress Notes (Signed)
[facility]  [address], [address] [place], [place] [address] Phone: [phone]  Fax: [phone]   Clinic Day:  05/29/2019  Referring physician: Clarisse Gouge, MD  Chief Complaint: Amanda Downs is a 68 y.o. female with stage IC left breast cancer who is seen for 6 month assessment, review of interval mammogram, and Prolia.  HPI: The patient was last seen in the medical oncology clinic on 11/28/2018. At that time, she denied any breast concerns. Exam was stable. Hemoglobin was 10.9. CA27.29 was normal. She received Prolia.    Diagnostic mammogram on 02/05/2019 at [facility] revealed scattered fibroglandular tissue in both breasts.  There was no evidence of malignancy.   During the interim, she has done well. Every now and then she sleeps with her head propped up by pillows especially if she eats spicy foods. She has heartburn. She is taking Linzess for constipation.   She denies any breast concerns. She examines her breast monthly. She is taking calcium and vitamin D. She is taking a multi-vitamin with iron since iron tablets make her constipated. She is taking oral  B-12. She denies melena, hematuria, or blood in her stools.   Her weight is up 3 pounds. She is eating well. She likes to eat a lot of salads. She has ice pica. She denies restless legs.    Past Medical History:  Diagnosis Date  . Bariatric surgery status   . Cancer (HCC)    BREAST  . CHF (congestive heart failure) ([place])   . Diabetes mellitus without complication ([place])   . GERD (gastroesophageal reflux disease)   . Hyperlipemia   . Hypertension   . Mitral valve disorder   . Postsurgical malabsorption     Past Surgical History:  Procedure Laterality Date  . ABDOMINAL HYSTERECTOMY    . BREAST SURGERY Left   . COLONOSCOPY WITH PROPOFOL N/A 03/17/2016   Procedure: COLONOSCOPY WITH PROPOFOL;  Surgeon: Manya Silvas, MD;  Location: [facility] ENDOSCOPY;  Service: Endoscopy;   Laterality: N/A;  . HERNIA REPAIR    . ROUX-EN-Y GASTRIC BYPASS    . TONSILLECTOMY    . TUBAL LIGATION      Family History  Problem Relation Age of Onset  . Colon cancer Mother   . Stomach cancer Mother     Social History:  reports that she is a non-smoker but has been exposed to tobacco smoke. She has never used smokeless tobacco. She reports that she does not drink alcohol or use drugs.  She is back to work as a Programmer, multimedia.  She lives in [place]. The patient is alone today.  Allergies:  Allergies  Allergen Reactions  . Lipitor [Atorvastatin] Other (See Comments)  . Rosuvastatin     Other reaction(s): Muscle Pain  . Simvastatin Other (See Comments)    Current Medications: Current Outpatient Medications  Medication Sig Dispense Refill  . carvedilol (COREG) 6.25 MG tablet Take 6.25 mg by mouth 2 (two) times daily.    . cyanocobalamin ([employer] VITAMIN B12) 2000 MCG tablet Take by mouth as needed.     . furosemide (LASIX) 40 MG tablet Take 40 mg by mouth daily.     Marland Kitchen LINZESS 145 MCG CAPS capsule     . magnesium oxide (MAG-OX) 400 MG tablet Take 400 mg by mouth daily.     . Melatonin 3 MG TABS Take 1 tablet by mouth at bedtime.     . Omega-3 Fatty Acids (FISH OIL) 1000 MG CAPS  Take 1 capsule by mouth as needed.     . ramipril (ALTACE) 5 MG capsule Take 5 mg by mouth 2 (two) times daily.     Marland Kitchen saccharomyces boulardii (FLORASTOR) 250 MG capsule Take 250 mg by mouth daily.     Marland Kitchen spironolactone (ALDACTONE) 25 MG tablet Take 25 mg by mouth daily.     . Vitamin D, Cholecalciferol, 400 UNITS CAPS Take 1 capsule by mouth daily.     Marland Kitchen esomeprazole (NEXIUM 24HR) 20 MG capsule take 1 capsule as needed    . ezetimibe (ZETIA) 10 MG tablet Take 10 mg by mouth daily.     Marland Kitchen levocetirizine (XYZAL) 5 MG tablet Take 5 mg by mouth every evening.     . metFORMIN (GLUCOPHAGE) 500 MG tablet Take 500 mg by mouth 2 (two) times daily with a meal.     . triamcinolone (NASACORT) 55 MCG/ACT AERO nasal  inhaler Place 1 spray into the nose daily.      No current facility-administered medications for this visit.     Review of Systems  Constitutional: Negative.  Negative for chills, diaphoresis, fever, malaise/fatigue and weight loss (up 3 pounds).       Doing well.  HENT: Negative.  Negative for congestion, ear pain, hearing loss, nosebleeds, sinus pain and sore throat.   Eyes: Negative.  Negative for blurred vision, double vision and photophobia.  Respiratory: Negative.  Negative for cough, hemoptysis, sputum production, shortness of breath and wheezing.   Cardiovascular: Negative for chest pain, palpitations, orthopnea and leg swelling.       H/o CHF (saw Dr. Nehemiah Massed on 05/28/2019).  EF 45% on 09/14/2018.  Gastrointestinal: Positive for constipation (on Linzess) and heartburn (occasionally). Negative for abdominal pain, diarrhea, nausea and vomiting.       Eating well. Ice pica.  Genitourinary: Negative.  Negative for dysuria, frequency, hematuria and urgency.  Musculoskeletal: Negative.  Negative for back pain, joint pain, myalgias and neck pain.  Skin: Negative.  Negative for itching and rash.  Neurological: Negative.  Negative for dizziness, tingling, sensory change, speech change, focal weakness, weakness and headaches.  Endo/Heme/Allergies: Negative.  Does not bruise/bleed easily.  Psychiatric/Behavioral: Negative.  Negative for depression and memory loss. The patient is not nervous/anxious and does not have insomnia.   All other systems reviewed and are negative.  Performance status (ECOG): 0  Vitals Blood pressure 118/77, pulse 62, temperature 97.7 F (36.5 C), temperature source Tympanic, resp. rate 18, height _0  (1.6 m), weight 167 lb 8.8 oz (76 kg), SpO2 100 %.  Physical Exam  Constitutional: She is oriented to person, place, and time. She appears well-developed and well-nourished. No distress.  HENT:  Head: Normocephalic and atraumatic.  Mouth/Throat: Oropharynx is  clear and moist. No oropharyngeal exudate.  Frosted short hair. Mask.  Eyes: Pupils are equal, round, and reactive to light. Conjunctivae and EOM are normal. No scleral icterus.  Oval glasses.  Hazel eyes.   Neck: Normal range of motion. Neck supple. No JVD present.  Cardiovascular: Normal rate, regular rhythm and normal heart sounds. Exam reveals no gallop and no friction rub.  No murmur heard. Pulmonary/Chest: Effort normal and breath sounds normal. No respiratory distress. She has no wheezes. She has no rales. She exhibits no tenderness. Right breast exhibits no mass, no nipple discharge and no skin change. Left breast exhibits no mass, no nipple discharge, no skin change and no tenderness (inferior medial edema).  Abdominal: Soft. Bowel sounds are normal. She exhibits no  distension and no mass. There is no abdominal tenderness. There is no rebound and no guarding.  Musculoskeletal: Normal range of motion.        General: No edema.  Lymphadenopathy:       Head (right side): No preauricular, no posterior auricular and no occipital adenopathy present.       Head (left side): No preauricular, no posterior auricular and no occipital adenopathy present.    She has no cervical adenopathy.    She has no axillary adenopathy.       Right: No inguinal and no supraclavicular adenopathy present.       Left: No inguinal and no supraclavicular adenopathy present.  Neurological: She is alert and oriented to person, place, and time.  Skin: Skin is warm and dry. No rash noted. She is not diaphoretic. No erythema. No pallor.  Psychiatric: She has a normal mood and affect. Her behavior is normal. Judgment and thought content normal.  Nursing note and vitals reviewed.   Appointment on 05/29/2019  Component Date Value Ref Range Status  . Sodium 05/29/2019 128* 135 - 145 mmol/L Final  . Potassium 05/29/2019 4.0  3.5 - 5.1 mmol/L Final  . Chloride 05/29/2019 93* 98 - 111 mmol/L Final  . CO2 05/29/2019 25   22 - 32 mmol/L Final  . Glucose, Bld 05/29/2019 109* 70 - 99 mg/dL Final  . BUN 05/29/2019 21  8 - 23 mg/dL Final  . Creatinine, Ser 05/29/2019 1.08* 0.44 - 1.00 mg/dL Final  . Calcium 05/29/2019 8.9  8.9 - 10.3 mg/dL Final  . Total Protein 05/29/2019 7.2  6.5 - 8.1 g/dL Final  . Albumin 05/29/2019 4.1  3.5 - 5.0 g/dL Final  . AST 05/29/2019 23  15 - 41 U/L Final  . ALT 05/29/2019 16  0 - 44 U/L Final  . Alkaline Phosphatase 05/29/2019 58  38 - 126 U/L Final  . Total Bilirubin 05/29/2019 0.6  0.3 - 1.2 mg/dL Final  . GFR calc non Af Amer 05/29/2019 52* >60 mL/min Final  . GFR calc Af Amer 05/29/2019 >60  >60 mL/min Final  . Anion gap 05/29/2019 10  5 - 15 Final   Performed at [facility], [address], [place], [place] [address]  . WBC 05/29/2019 6.5  4.0 - 10.5 K/uL Final  . RBC 05/29/2019 4.09  3.87 - 5.11 MIL/uL Final  . Hemoglobin 05/29/2019 10.5* 12.0 - 15.0 g/dL Final  . HCT 05/29/2019 31.8* 36.0 - 46.0 % Final  . MCV 05/29/2019 77.8* 80.0 - 100.0 fL Final  . MCH 05/29/2019 25.7* 26.0 - 34.0 pg Final  . MCHC 05/29/2019 33.0  30.0 - 36.0 g/dL Final  . RDW 05/29/2019 16.4* 11.5 - 15.5 % Final  . Platelets 05/29/2019 349  150 - 400 K/uL Final  . nRBC 05/29/2019 0.0  0.0 - 0.2 % Final  . Neutrophils Relative % 05/29/2019 64  % Final  . Neutro Abs 05/29/2019 4.1  1.7 - 7.7 K/uL Final  . Lymphocytes Relative 05/29/2019 20  % Final  . Lymphs Abs 05/29/2019 1.3  0.7 - 4.0 K/uL Final  . Monocytes Relative 05/29/2019 10  % Final  . Monocytes Absolute 05/29/2019 0.6  0.1 - 1.0 K/uL Final  . Eosinophils Relative 05/29/2019 4  % Final  . Eosinophils Absolute 05/29/2019 0.3  0.0 - 0.5 K/uL Final  . Basophils Relative 05/29/2019 1  % Final  . Basophils Absolute 05/29/2019 0.1  0.0 - 0.1 K/uL Final  .  Immature Granulocytes 05/29/2019 1  % Final  . Abs Immature Granulocytes 05/29/2019 0.05  0.00 - 0.07 K/uL Final   Performed at [facility], [address], [place], [place] [address]    Assessment:  KYRAH SCHIRO is a 68 y.o. female with stage IC left breast cancers/p left partial mastectomy on 06/08/2010. Pathology revealed a 1.1 cm grade I invasive carcinoma. 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 The more superficial smaller carcinoma extended to the inked margin at the caudal aspect in an area of 3 mm in linear extent. One sentinel lymph node was negative. DCIS was present. She underwent re-excision for a positive margin. Tumor was ER+, PR+, Her2/neu -. Pathologic stage was [id number].  She received radiationfrom 07/06/2010 - 08/19/2010. She was on Arimidexfrom 09/17/2010 - 03/19/2016.  Bilateral mammogramon 12/16/2015 revealed no evidence of malignancy. Bilateral mammogram at [facility] 01/26/2017 revealed no evidence of malignancy. [facility] on [facility]/11/2019revealed no evidence of malignancy. Diagnostic mammogram on 02/05/2019 at [facility] revealed no evidence of malignancy.   CA27.29has been followed: 24.1 on 02/18/2011, 20.9 on 09/09/2011, 24.7 on 05/11/2012, 21.7 on 06/19/2013, 16.4 on 02/20/2016, 16.1 on 03/28/2017, 13.4 on 03/30/2018, and 27.9 on 11/28/2018.  BCI testing on 03/02/2016 revealed a 3.6% risk of late recurrence between years 5 through 10. The likelihood of benefit of extended endocrine therapy is low.  Bone density on 03/15/2016 revealed osteoporosiswith a T score of -2.7 in the left hip. Bone density on 03/14/2018 revealed osteopeniawith a T-score of -2.0 in the right femur. She began Proliaon 03/30/2016 (last 11/28/2018).  She has iron deficiency anemia s/p gastric bypass surgery (07/11/2014).  Ferritin was 6 with an iron saturation of 9% and a TIBC of 485 on 09/26/2018.  Diet is good.  She denies any bleeding.  She is on oral iron.  Colonoscopy on 03/17/2016 revealed 2 diminutive polyps in the proximal ascending colon and in  the cecum (tubular adenomas).  She has chronic hyponatremia. She is on spironolactone and Lasix.  Symptomatically, she is doing well.  Exam is stable.  Plan: 1.   Labs today: CBC with diff, CMP, ferritin, iron studies, B12, folate, CA 27.29.  2.   Stage IC left breast cancer Clinically, she is doing well.  Exam is stable.  Mammogram on 02/05/2019 revealed scattered fibroglandular tissue in both breasts and no evidence of malignancy.  Continue surveillance. 3.   Osteopenia Bone density improved from osteoporosis to osteopenia with Prolia. Continue calcium and vitamin D.             Prolia today.             Continue Prolia every 6 months.  Next bone density due 03/14/2020. 4.  Iron deficiency anemia             Patient is s/p gastric bypass surgery on 07/11/2014.             Colonoscopy on 03/17/2016 revealed 2 diminutive polyps in the proximal ascending colon and in the cecum (tubular adenomas).             Hemoglobin 11.1 to 10.9 to 10.5 since 03/30/2018.             Ferritin 7 with an iron saturation of 10% and a TIBC 518 today.             Diet appears good (meat 8-10 x/week).  No improvement with MVI + iron.  No history of melena, hematochezia or hematuria.             Etiology may be secondary to absorption issues s/p gastric surgery.              Preauth Venofer.  RTC weekly x 3 for Venofer. 5.   Hyponatremia, chronic Sodium 128.             Follow-up with Dr Vickki Muff. 6.   RTC in 3 months for labs (CBC, ferritin, iron studies). 7.   RTC in 6 months for MD assessment, labs (CBC with diff, CMP, CA27.29) and Prolia.    I discussed the assessment and treatment plan with the patient.  The patient was provided an opportunity to ask questions and all were answered.  The patient agreed with the plan and demonstrated an understanding of the instructions.  The patient was advised to call back if the symptoms worsen or if  the condition fails to improve as anticipated.  I provided 19 minutes of face-to-face time during this this encounter and > 50% was spent counseling as documented under my assessment and plan.    Lequita Asal, MD, PhD    05/29/2019, 9:46 AM  I, Selena Batten, am acting as scribe for [employer]. Mike Gip, MD, PhD.  I, Melissa C. Mike Gip, MD, have reviewed the above documentation for accuracy and completeness, and I agree with the above.

## 2019-05-28 ENCOUNTER — Encounter: Payer: Self-pay | Admitting: Hematology and Oncology

## 2019-05-28 ENCOUNTER — Other Ambulatory Visit: Payer: Self-pay

## 2019-05-29 ENCOUNTER — Encounter: Payer: Self-pay | Admitting: Hematology and Oncology

## 2019-05-29 ENCOUNTER — Inpatient Hospital Stay: Payer: Medicare Other | Attending: Hematology and Oncology | Admitting: Hematology and Oncology

## 2019-05-29 ENCOUNTER — Other Ambulatory Visit: Payer: Self-pay | Admitting: Hematology and Oncology

## 2019-05-29 ENCOUNTER — Inpatient Hospital Stay: Payer: Medicare Other

## 2019-05-29 ENCOUNTER — Telehealth: Payer: Self-pay

## 2019-05-29 ENCOUNTER — Other Ambulatory Visit: Payer: Self-pay

## 2019-05-29 VITALS — BP 118/77 | HR 62 | Temp 97.7°F | Resp 18 | Ht 63.0 in | Wt 167.5 lb

## 2019-05-29 DIAGNOSIS — I509 Heart failure, unspecified: Secondary | ICD-10-CM | POA: Insufficient documentation

## 2019-05-29 DIAGNOSIS — M8580 Other specified disorders of bone density and structure, unspecified site: Secondary | ICD-10-CM | POA: Diagnosis not present

## 2019-05-29 DIAGNOSIS — E785 Hyperlipidemia, unspecified: Secondary | ICD-10-CM | POA: Insufficient documentation

## 2019-05-29 DIAGNOSIS — Z90710 Acquired absence of both cervix and uterus: Secondary | ICD-10-CM | POA: Diagnosis not present

## 2019-05-29 DIAGNOSIS — D649 Anemia, unspecified: Secondary | ICD-10-CM

## 2019-05-29 DIAGNOSIS — M85851 Other specified disorders of bone density and structure, right thigh: Secondary | ICD-10-CM

## 2019-05-29 DIAGNOSIS — D509 Iron deficiency anemia, unspecified: Secondary | ICD-10-CM | POA: Diagnosis present

## 2019-05-29 DIAGNOSIS — Z800 Family history of malignant neoplasm of digestive organs: Secondary | ICD-10-CM | POA: Insufficient documentation

## 2019-05-29 DIAGNOSIS — Z853 Personal history of malignant neoplasm of breast: Secondary | ICD-10-CM | POA: Diagnosis not present

## 2019-05-29 DIAGNOSIS — C50912 Malignant neoplasm of unspecified site of left female breast: Secondary | ICD-10-CM | POA: Diagnosis not present

## 2019-05-29 DIAGNOSIS — E871 Hypo-osmolality and hyponatremia: Secondary | ICD-10-CM | POA: Diagnosis not present

## 2019-05-29 DIAGNOSIS — Z9884 Bariatric surgery status: Secondary | ICD-10-CM | POA: Insufficient documentation

## 2019-05-29 DIAGNOSIS — Z170 Estrogen receptor positive status [ER+]: Secondary | ICD-10-CM

## 2019-05-29 DIAGNOSIS — Z79899 Other long term (current) drug therapy: Secondary | ICD-10-CM | POA: Diagnosis not present

## 2019-05-29 DIAGNOSIS — I110 Hypertensive heart disease with heart failure: Secondary | ICD-10-CM | POA: Insufficient documentation

## 2019-05-29 DIAGNOSIS — E119 Type 2 diabetes mellitus without complications: Secondary | ICD-10-CM | POA: Insufficient documentation

## 2019-05-29 DIAGNOSIS — M810 Age-related osteoporosis without current pathological fracture: Secondary | ICD-10-CM

## 2019-05-29 DIAGNOSIS — Z7984 Long term (current) use of oral hypoglycemic drugs: Secondary | ICD-10-CM | POA: Diagnosis not present

## 2019-05-29 LAB — COMPREHENSIVE METABOLIC PANEL
ALT: 16 U/L (ref 0–44)
AST: 23 U/L (ref 15–41)
Albumin: 4.1 g/dL (ref 3.5–5.0)
Alkaline Phosphatase: 58 U/L (ref 38–126)
Anion gap: 10 (ref 5–15)
BUN: 21 mg/dL (ref 8–23)
CO2: 25 mmol/L (ref 22–32)
Calcium: 8.9 mg/dL (ref 8.9–10.3)
Chloride: 93 mmol/L — ABNORMAL LOW (ref 98–111)
Creatinine, Ser: 1.08 mg/dL — ABNORMAL HIGH (ref 0.44–1.00)
GFR calc Af Amer: >60 mL/min (ref >60)
GFR calc non Af Amer: 52 mL/min — ABNORMAL LOW (ref >60)
Glucose, Bld: 109 mg/dL — ABNORMAL HIGH (ref 70–99)
Potassium: 4 mmol/L (ref 3.5–5.1)
Sodium: 128 mmol/L — ABNORMAL LOW (ref 135–145)
Total Bilirubin: 0.6 mg/dL (ref 0.3–1.2)
Total Protein: 7.2 g/dL (ref 6.5–8.1)

## 2019-05-29 LAB — CBC WITH DIFFERENTIAL/PLATELET
Abs Immature Granulocytes: 0.05 10*3/uL (ref 0.00–0.07)
Basophils Absolute: 0.1 10*3/uL (ref 0.0–0.1)
Basophils Relative: 1 %
Eosinophils Absolute: 0.3 10*3/uL (ref 0.0–0.5)
Eosinophils Relative: 4 %
HCT: 31.8 % — ABNORMAL LOW (ref 36.0–46.0)
Hemoglobin: 10.5 g/dL — ABNORMAL LOW (ref 12.0–15.0)
Immature Granulocytes: 1 %
Lymphocytes Relative: 20 %
Lymphs Abs: 1.3 10*3/uL (ref 0.7–4.0)
MCH: 25.7 pg — ABNORMAL LOW (ref 26.0–34.0)
MCHC: 33 g/dL (ref 30.0–36.0)
MCV: 77.8 fL — ABNORMAL LOW (ref 80.0–100.0)
Monocytes Absolute: 0.6 10*3/uL (ref 0.1–1.0)
Monocytes Relative: 10 %
Neutro Abs: 4.1 10*3/uL (ref 1.7–7.7)
Neutrophils Relative %: 64 %
Platelets: 349 10*3/uL (ref 150–400)
RBC: 4.09 MIL/uL (ref 3.87–5.11)
RDW: 16.4 % — ABNORMAL HIGH (ref 11.5–15.5)
WBC: 6.5 10*3/uL (ref 4.0–10.5)
nRBC: 0 % (ref 0.0–0.2)

## 2019-05-29 LAB — IRON AND TIBC
Iron: 54 ug/dL (ref 28–170)
Saturation Ratios: 10 % — ABNORMAL LOW (ref 10.4–31.8)
TIBC: 518 ug/dL — ABNORMAL HIGH (ref 250–450)
UIBC: 464 ug/dL

## 2019-05-29 LAB — FOLATE: Folate: 27 ng/mL (ref >5.9)

## 2019-05-29 LAB — VITAMIN B12: Vitamin B-12: 652 pg/mL (ref 180–914)

## 2019-05-29 LAB — FERRITIN: Ferritin: 7 ng/mL — ABNORMAL LOW (ref 11–307)

## 2019-05-29 MED ORDER — DENOSUMAB 60 MG/ML ~~LOC~~ SOSY
60.0000 mg | PREFILLED_SYRINGE | Freq: Once | SUBCUTANEOUS | Status: AC
  Administered 2019-05-29: 60 mg via SUBCUTANEOUS

## 2019-05-29 NOTE — Patient Instructions (Signed)

## 2019-05-29 NOTE — Progress Notes (Signed)
Patient has no questions or concerns today.

## 2019-05-29 NOTE — Patient Instructions (Signed)
Denosumab injection What is this medicine? DENOSUMAB (den oh sue mab) slows bone breakdown. Prolia is used to treat osteoporosis in women after menopause and in men, and in people who are taking corticosteroids for 6 months or more. Xgeva is used to treat a high calcium level due to cancer and to prevent bone fractures and other bone problems caused by multiple myeloma or cancer bone metastases. Xgeva is also used to treat giant cell tumor of the bone. This medicine may be used for other purposes; ask your health care provider or pharmacist if you have questions. COMMON BRAND NAME(S): Prolia, XGEVA What should I tell my health care provider before I take this medicine? They need to know if you have any of these conditions:  dental disease  having surgery or tooth extraction  infection  kidney disease  low levels of calcium or Vitamin D in the blood  malnutrition  on hemodialysis  skin conditions or sensitivity  thyroid or parathyroid disease  an unusual reaction to denosumab, other medicines, foods, dyes, or preservatives  pregnant or trying to get pregnant  breast-feeding How should I use this medicine? This medicine is for injection under the skin. It is given by a health care professional in a hospital or clinic setting. A special MedGuide will be given to you before each treatment. Be sure to read this information carefully each time. For Prolia, talk to your pediatrician regarding the use of this medicine in children. Special care may be needed. For Xgeva, talk to your pediatrician regarding the use of this medicine in children. While this drug may be prescribed for children as young as 13 years for selected conditions, precautions do apply. Overdosage: If you think you have taken too much of this medicine contact a poison control center or emergency room at once. NOTE: This medicine is only for you. Do not share this medicine with others. What if I miss a dose? It is  important not to miss your dose. Call your doctor or health care professional if you are unable to keep an appointment. What may interact with this medicine? Do not take this medicine with any of the following medications:  other medicines containing denosumab This medicine may also interact with the following medications:  medicines that lower your chance of fighting infection  steroid medicines like prednisone or cortisone This list may not describe all possible interactions. Give your health care provider a list of all the medicines, herbs, non-prescription drugs, or dietary supplements you use. Also tell them if you smoke, drink alcohol, or use illegal drugs. Some items may interact with your medicine. What should I watch for while using this medicine? Visit your doctor or health care professional for regular checks on your progress. Your doctor or health care professional may order blood tests and other tests to see how you are doing. Call your doctor or health care professional for advice if you get a fever, chills or sore throat, or other symptoms of a cold or flu. Do not treat yourself. This drug may decrease your body's ability to fight infection. Try to avoid being around people who are sick. You should make sure you get enough calcium and vitamin D while you are taking this medicine, unless your doctor tells you not to. Discuss the foods you eat and the vitamins you take with your health care professional. See your dentist regularly. Brush and floss your teeth as directed. Before you have any dental work done, tell your dentist you are   receiving this medicine. Do not become pregnant while taking this medicine or for 5 months after stopping it. Talk with your doctor or health care professional about your birth control options while taking this medicine. Women should inform their doctor if they wish to become pregnant or think they might be pregnant. There is a potential for serious side  effects to an unborn child. Talk to your health care professional or pharmacist for more information. What side effects may I notice from receiving this medicine? Side effects that you should report to your doctor or health care professional as soon as possible:  allergic reactions like skin rash, itching or hives, swelling of the face, lips, or tongue  bone pain  breathing problems  dizziness  jaw pain, especially after dental work  redness, blistering, peeling of the skin  signs and symptoms of infection like fever or chills; cough; sore throat; pain or trouble passing urine  signs of low calcium like fast heartbeat, muscle cramps or muscle pain; pain, tingling, numbness in the hands or feet; seizures  unusual bleeding or bruising  unusually weak or tired Side effects that usually do not require medical attention (report to your doctor or health care professional if they continue or are bothersome):  constipation  diarrhea  headache  joint pain  loss of appetite  muscle pain  runny nose  tiredness  upset stomach This list may not describe all possible side effects. Call your doctor for medical advice about side effects. You may report side effects to FDA at 1-800-FDA-1088. Where should I keep my medicine? This medicine is only given in a clinic, doctor's office, or other health care setting and will not be stored at home. NOTE: This sheet is a summary. It may not cover all possible information. If you have questions about this medicine, talk to your doctor, pharmacist, or health care provider.  2020 Elsevier/Gold Standard (2017-11-11 16:10:44)

## 2019-05-29 NOTE — Telephone Encounter (Signed)
Spoke with the patient to inform her that her Ferritin levels were 7 and Dr Mike Gip would like for the patient to get Venofer weekly x 4 . The patient was understanding and agreeable.

## 2019-05-29 NOTE — Telephone Encounter (Signed)
-----   Message from Lequita Asal, MD sent at 05/29/2019  1:43 PM EST ----- Regarding: Please call patient  Amanda Downs,  Ferritin was 7.  Let's make sure she is scheduled for Venofer weekly x 4.  (I think she was set up for weekly x 3 before I knew her results today).  M ----- Message ----- From: Buel Ream, [facility] [place] Sent: 05/29/2019   9:08 AM EST To: Lequita Asal, MD

## 2019-05-30 LAB — CANCER ANTIGEN 27.29: CA 27.29: 19.6 U/mL (ref 0.0–38.6)

## 2019-06-04 ENCOUNTER — Other Ambulatory Visit: Payer: Self-pay

## 2019-06-05 ENCOUNTER — Inpatient Hospital Stay: Payer: Medicare Other

## 2019-06-05 VITALS — BP 102/67 | HR 65 | Temp 98.1°F | Resp 18

## 2019-06-05 DIAGNOSIS — D509 Iron deficiency anemia, unspecified: Secondary | ICD-10-CM | POA: Diagnosis not present

## 2019-06-05 DIAGNOSIS — M85851 Other specified disorders of bone density and structure, right thigh: Secondary | ICD-10-CM

## 2019-06-05 MED ORDER — IRON SUCROSE 20 MG/ML IV SOLN
200.0000 mg | Freq: Once | INTRAVENOUS | Status: AC
  Administered 2019-06-05: 14:00:00 200 mg via INTRAVENOUS

## 2019-06-05 MED ORDER — SODIUM CHLORIDE 0.9 % IV SOLN
Freq: Once | INTRAVENOUS | Status: AC
  Administered 2019-06-05: 14:00:00 via INTRAVENOUS
  Filled 2019-06-05: qty 250

## 2019-06-05 MED ORDER — SODIUM CHLORIDE 0.9 % IV SOLN: 200.0000 mg | Freq: Once | INTRAVENOUS | Status: DC

## 2019-06-05 NOTE — Patient Instructions (Signed)

## 2019-06-12 ENCOUNTER — Inpatient Hospital Stay: Payer: Medicare Other

## 2019-06-12 ENCOUNTER — Other Ambulatory Visit: Payer: Self-pay

## 2019-06-12 VITALS — BP 90/58 | HR 61 | Temp 98.2°F | Resp 16

## 2019-06-12 DIAGNOSIS — M85851 Other specified disorders of bone density and structure, right thigh: Secondary | ICD-10-CM

## 2019-06-12 DIAGNOSIS — D509 Iron deficiency anemia, unspecified: Secondary | ICD-10-CM | POA: Diagnosis not present

## 2019-06-12 MED ORDER — SODIUM CHLORIDE 0.9 % IV SOLN
Freq: Once | INTRAVENOUS | Status: AC
  Administered 2019-06-12: 14:00:00 via INTRAVENOUS
  Filled 2019-06-12: qty 250

## 2019-06-12 MED ORDER — IRON SUCROSE 20 MG/ML IV SOLN
200.0000 mg | Freq: Once | INTRAVENOUS | Status: AC
  Administered 2019-06-12: 200 mg via INTRAVENOUS
  Filled 2019-06-12: qty 10

## 2019-06-12 MED ORDER — SODIUM CHLORIDE 0.9 % IV SOLN: 200.0000 mg | Freq: Once | INTRAVENOUS | Status: DC

## 2019-06-12 NOTE — Patient Instructions (Signed)

## 2019-06-18 ENCOUNTER — Other Ambulatory Visit: Payer: Self-pay

## 2019-06-19 ENCOUNTER — Inpatient Hospital Stay: Payer: Medicare Other | Attending: Hematology and Oncology

## 2019-06-19 VITALS — BP 109/71 | HR 63 | Temp 96.9°F | Resp 16

## 2019-06-19 DIAGNOSIS — D508 Other iron deficiency anemias: Secondary | ICD-10-CM | POA: Diagnosis present

## 2019-06-19 DIAGNOSIS — Z9884 Bariatric surgery status: Secondary | ICD-10-CM | POA: Diagnosis not present

## 2019-06-19 DIAGNOSIS — M85851 Other specified disorders of bone density and structure, right thigh: Secondary | ICD-10-CM

## 2019-06-19 MED ORDER — SODIUM CHLORIDE 0.9 % IV SOLN: 200.0000 mg | Freq: Once | INTRAVENOUS | Status: DC

## 2019-06-19 MED ORDER — SODIUM CHLORIDE 0.9 % IV SOLN
Freq: Once | INTRAVENOUS | Status: AC
  Administered 2019-06-19: 14:00:00 via INTRAVENOUS
  Filled 2019-06-19: qty 250

## 2019-06-19 MED ORDER — IRON SUCROSE 20 MG/ML IV SOLN
200.0000 mg | Freq: Once | INTRAVENOUS | Status: AC
  Administered 2019-06-19: 200 mg via INTRAVENOUS

## 2019-06-19 NOTE — Patient Instructions (Signed)

## 2019-06-22 ENCOUNTER — Other Ambulatory Visit: Payer: Self-pay

## 2019-06-25 ENCOUNTER — Inpatient Hospital Stay: Payer: Medicare Other

## 2019-06-25 ENCOUNTER — Other Ambulatory Visit: Payer: Self-pay

## 2019-06-25 VITALS — BP 111/71 | HR 60 | Temp 97.7°F | Resp 18

## 2019-06-25 DIAGNOSIS — D508 Other iron deficiency anemias: Secondary | ICD-10-CM | POA: Diagnosis not present

## 2019-06-25 DIAGNOSIS — M85851 Other specified disorders of bone density and structure, right thigh: Secondary | ICD-10-CM

## 2019-06-25 MED ORDER — SODIUM CHLORIDE 0.9 % IV SOLN
Freq: Once | INTRAVENOUS | Status: AC
  Administered 2019-06-25: 14:00:00 via INTRAVENOUS
  Filled 2019-06-25: qty 250

## 2019-06-25 MED ORDER — IRON SUCROSE 20 MG/ML IV SOLN
200.0000 mg | Freq: Once | INTRAVENOUS | Status: AC
  Administered 2019-06-25: 200 mg via INTRAVENOUS

## 2019-06-25 MED ORDER — SODIUM CHLORIDE 0.9 % IV SOLN: 200.0000 mg | Freq: Once | INTRAVENOUS | Status: DC

## 2019-06-26 ENCOUNTER — Ambulatory Visit: Payer: Medicare Other

## 2019-07-03 ENCOUNTER — Encounter: Payer: Self-pay | Admitting: Ophthalmology

## 2019-08-28 ENCOUNTER — Inpatient Hospital Stay: Payer: Medicare PPO | Attending: Hematology and Oncology

## 2019-08-28 ENCOUNTER — Other Ambulatory Visit: Payer: Self-pay

## 2019-08-28 DIAGNOSIS — C50912 Malignant neoplasm of unspecified site of left female breast: Secondary | ICD-10-CM | POA: Diagnosis not present

## 2019-08-28 DIAGNOSIS — Z79899 Other long term (current) drug therapy: Secondary | ICD-10-CM | POA: Insufficient documentation

## 2019-08-28 DIAGNOSIS — Z9884 Bariatric surgery status: Secondary | ICD-10-CM | POA: Diagnosis not present

## 2019-08-28 DIAGNOSIS — E119 Type 2 diabetes mellitus without complications: Secondary | ICD-10-CM | POA: Diagnosis not present

## 2019-08-28 DIAGNOSIS — I509 Heart failure, unspecified: Secondary | ICD-10-CM | POA: Diagnosis not present

## 2019-08-28 DIAGNOSIS — Z7984 Long term (current) use of oral hypoglycemic drugs: Secondary | ICD-10-CM | POA: Insufficient documentation

## 2019-08-28 DIAGNOSIS — M8580 Other specified disorders of bone density and structure, unspecified site: Secondary | ICD-10-CM | POA: Diagnosis not present

## 2019-08-28 DIAGNOSIS — Z170 Estrogen receptor positive status [ER+]: Secondary | ICD-10-CM | POA: Insufficient documentation

## 2019-08-28 DIAGNOSIS — Z800 Family history of malignant neoplasm of digestive organs: Secondary | ICD-10-CM | POA: Insufficient documentation

## 2019-08-28 DIAGNOSIS — E785 Hyperlipidemia, unspecified: Secondary | ICD-10-CM | POA: Diagnosis not present

## 2019-08-28 DIAGNOSIS — I110 Hypertensive heart disease with heart failure: Secondary | ICD-10-CM | POA: Diagnosis not present

## 2019-08-28 DIAGNOSIS — D508 Other iron deficiency anemias: Secondary | ICD-10-CM | POA: Diagnosis not present

## 2019-08-28 LAB — CBC WITH DIFFERENTIAL/PLATELET
Abs Immature Granulocytes: 0.1 10*3/uL — ABNORMAL HIGH (ref 0.00–0.07)
Basophils Absolute: 0.1 10*3/uL (ref 0.0–0.1)
Basophils Relative: 1 %
Eosinophils Absolute: 0.3 10*3/uL (ref 0.0–0.5)
Eosinophils Relative: 5 %
HCT: 37.5 % (ref 36.0–46.0)
Hemoglobin: 12.8 g/dL (ref 12.0–15.0)
Immature Granulocytes: 2 %
Lymphocytes Relative: 26 %
Lymphs Abs: 1.7 10*3/uL (ref 0.7–4.0)
MCH: 28.8 pg (ref 26.0–34.0)
MCHC: 34.1 g/dL (ref 30.0–36.0)
MCV: 84.3 fL (ref 80.0–100.0)
Monocytes Absolute: 0.7 10*3/uL (ref 0.1–1.0)
Monocytes Relative: 10 %
Neutro Abs: 3.6 10*3/uL (ref 1.7–7.7)
Neutrophils Relative %: 56 %
Platelets: 283 10*3/uL (ref 150–400)
RBC: 4.45 MIL/uL (ref 3.87–5.11)
RDW: 18 % — ABNORMAL HIGH (ref 11.5–15.5)
WBC: 6.5 10*3/uL (ref 4.0–10.5)
nRBC: 0 % (ref 0.0–0.2)

## 2019-08-28 LAB — IRON AND TIBC
Iron: 140 ug/dL (ref 28–170)
Saturation Ratios: 35 % — ABNORMAL HIGH (ref 10.4–31.8)
TIBC: 406 ug/dL (ref 250–450)
UIBC: 266 ug/dL

## 2019-08-28 LAB — FERRITIN: Ferritin: 46 ng/mL (ref 11–307)

## 2019-11-22 ENCOUNTER — Other Ambulatory Visit: Payer: Self-pay | Admitting: Hematology and Oncology

## 2019-11-22 NOTE — Progress Notes (Signed)
[facility]  [address], [address] [place], [place] [address] Phone: [phone]  Fax: [phone]   Clinic Day:  11/27/2019  Referring physician: Clarisse Gouge, MD  Chief Complaint: Amanda Downs is a 69 y.o. female with stage IC left breast cancer who is seen for 6 month assessment.  HPI: The patient was last seen in the medical oncology clinic on 05/29/2019. At that time, she was doing well.  Exam was stable.  Hematocrit was 31.8, hemoglobin 10.5, MCV 77.8, platelets 349,000, WBC 6,500. Ferritin was 7 with and iron saturation of 10% and a TIBC of 518. Sodium 128. Creatinine 1.08. Vitamin B12 was 652 with folate 27.0. CA 27.29 was 19.6.   She received Prolia.  She received Venofer x 4 (06/05/2019 - 06/25/2019).   Labs on 08/28/2019 revealed hematocrit 37.5, hemoglobin 12.8, MCV 84.3, platelets 283,000, WBC 6,500.  Ferritin was 46 with an iron saturation of 35% and TIBC of 406.   The patient went to Urgent Care on 11/19/2019 for acute cystitis without hematuria.  She was prescribed Macrobid x 5 days and Pyridium.   During the interim, the patient was doing well. She felt good after getting her IV Venofer. She continued to have some bladder pressure after being taking Macrobid and Pyridium. She feels like she has a UTI. She had a fall and noted trauma to her right hip. She has no breast concerns. She would like to continue having her mammograms done with [facility]. Her diet is good. She is eating well.   She props her head up at night due to acid reflux. She had no chest pain or shortness of breath. She continues to follow up with Dr. Nehemiah Massed. She has no ice pica and does not crave starch. The patient had 2 COVID-19 vaccines. She had her last COVID-19 vaccine on 09/07/2019. She denies having any side effects.    Past Medical History:  Diagnosis Date  . Bariatric surgery status   . Cancer (HCC)    BREAST  . CHF (congestive heart failure) ([place])   .  Diabetes mellitus without complication ([place])   . GERD (gastroesophageal reflux disease)   . Hyperlipemia   . Hypertension   . Mitral valve disorder   . Postsurgical malabsorption     Past Surgical History:  Procedure Laterality Date  . ABDOMINAL HYSTERECTOMY    . BREAST SURGERY Left   . COLONOSCOPY WITH PROPOFOL N/A 03/17/2016   Procedure: COLONOSCOPY WITH PROPOFOL;  Surgeon: Manya Silvas, MD;  Location: [facility] ENDOSCOPY;  Service: Endoscopy;  Laterality: N/A;  . HERNIA REPAIR    . ROUX-EN-Y GASTRIC BYPASS    . TONSILLECTOMY    . TUBAL LIGATION      Family History  Problem Relation Age of Onset  . Colon cancer Mother   . Stomach cancer Mother     Social History:  reports that she is a non-smoker but has been exposed to tobacco smoke. She has never used smokeless tobacco. She reports that she does not drink alcohol or use drugs.  She is back to work as a Programmer, multimedia.  She lives in [place]. The patient is alone today.  Allergies:  Allergies  Allergen Reactions  . Lipitor [Atorvastatin] Other (See Comments)  . Rosuvastatin     Other reaction(s): Muscle Pain  . Simvastatin Other (See Comments)    Current Medications: Current Outpatient Medications  Medication Sig Dispense Refill  . carvedilol (COREG) 6.25 MG tablet Take 6.25 mg by mouth  2 (two) times daily.    . cyanocobalamin ([employer]S VITAMIN B12) 2000 MCG tablet Take by mouth as needed.     . desonide (DESOWEN) 0.05 % cream APPLY TO SCALY AREA RIGHT OF EAR TWO TIMES DAILY FOR 7 DAYS MAX    . furosemide (LASIX) 40 MG tablet Take 40 mg by mouth daily.     Marland Kitchen LINZESS 145 MCG CAPS capsule     . magnesium oxide (MAG-OX) 400 MG tablet Take 400 mg by mouth daily.     . Melatonin 3 MG TABS Take 1 tablet by mouth at bedtime.     . Omega-3 Fatty Acids (FISH OIL) 1000 MG CAPS Take 1 capsule by mouth as needed.     . ramipril (ALTACE) 5 MG capsule Take 5 mg by mouth 2 (two) times daily.     Marland Kitchen spironolactone (ALDACTONE) 25 MG tablet  Take 25 mg by mouth daily.     . Triamcinolone Acetonide (CVS NASAL ALLERGY SPRAY NA) SMARTSIG:2 Spray(s) Both Nares Daily    . Vitamin D, Cholecalciferol, 400 UNITS CAPS Take 1 capsule by mouth daily.     Marland Kitchen esomeprazole (NEXIUM 24HR) 20 MG capsule take 1 capsule as needed    . ezetimibe (ZETIA) 10 MG tablet Take 10 mg by mouth daily.     Marland Kitchen levocetirizine (XYZAL) 5 MG tablet Take 5 mg by mouth every evening.     . metFORMIN (GLUCOPHAGE) 500 MG tablet Take 500 mg by mouth 2 (two) times daily with a meal.     . saccharomyces boulardii (FLORASTOR) 250 MG capsule Take 250 mg by mouth daily.     Marland Kitchen triamcinolone (NASACORT) 55 MCG/ACT AERO nasal inhaler Place 1 spray into the nose daily.      No current facility-administered medications for this visit.    Review of Systems  Constitutional: Negative.  Negative for chills, diaphoresis, fever, malaise/fatigue and weight loss (stable).       Doing well.  HENT: Negative.  Negative for congestion, ear pain, hearing loss, nosebleeds, sinus pain and sore throat.   Eyes: Negative.  Negative for blurred vision, double vision and photophobia.  Respiratory: Negative.  Negative for cough, hemoptysis, sputum production, shortness of breath and wheezing.   Cardiovascular: Negative for chest pain, palpitations, orthopnea and leg swelling.       H/o CHF (saw Dr. Nehemiah Massed on 05/28/2019).  EF 45% on 09/14/2018.  Gastrointestinal: Positive for heartburn (occasionally; propping head up at night to control acid reflux). Negative for abdominal pain, constipation (on Linzess), diarrhea, nausea and vomiting.       Good diet. Eating well. No ice pica.  Genitourinary: Negative.  Negative for dysuria, frequency, hematuria and urgency.       Bladder pressure.  Musculoskeletal: Positive for falls (fell on right hip). Negative for back pain, joint pain, myalgias and neck pain.  Skin: Negative.  Negative for itching and rash.  Neurological: Negative.  Negative for dizziness,  tingling, sensory change, speech change, focal weakness, weakness and headaches.  Endo/Heme/Allergies: Negative.  Does not bruise/bleed easily.  Psychiatric/Behavioral: Negative.  Negative for depression and memory loss. The patient is not nervous/anxious and does not have insomnia.   All other systems reviewed and are negative.  Performance status (ECOG): 1  Vitals Blood pressure 134/62, pulse 68, temperature (!) 97.1 F (36.2 C), temperature source Tympanic, resp. rate 16, weight 167 lb 7 oz (76 kg), SpO2 100 %.  Physical Exam  Constitutional: She is oriented to person, place, and time.  She appears well-developed and well-nourished. No distress.  HENT:  Head: Normocephalic and atraumatic.  Mouth/Throat: Oropharynx is clear and moist. No oropharyngeal exudate.  Frosted short hair. Mask.  Eyes: Pupils are equal, round, and reactive to light. Conjunctivae and EOM are normal. No scleral icterus.  Oval glasses.  Hazel eyes.   Neck: No JVD present.  Cardiovascular: Normal rate, regular rhythm and normal heart sounds. Exam reveals no gallop and no friction rub.  No murmur heard. Pulmonary/Chest: Effort normal and breath sounds normal. No respiratory distress. She has no wheezes. She has no rales. She exhibits no tenderness. Right breast exhibits no inverted nipple, no mass, no nipple discharge, no skin change and no tenderness. Left breast exhibits no inverted nipple, no mass, no nipple discharge, no skin change and no tenderness.  Scar tissue below LEFT breast.  Abdominal: Soft. Bowel sounds are normal. She exhibits no distension and no mass. There is no abdominal tenderness. There is no rebound and no guarding.  Musculoskeletal:        General: No edema. Normal range of motion.     Cervical back: Normal range of motion and neck supple.  Lymphadenopathy:       Head (right side): No preauricular, no posterior auricular and no occipital adenopathy present.       Head (left side): No  preauricular, no posterior auricular and no occipital adenopathy present.    She has no cervical adenopathy.    She has no axillary adenopathy.       Right: No supraclavicular adenopathy present.       Left: No supraclavicular adenopathy present.  Neurological: She is alert and oriented to person, place, and time.  Skin: Skin is warm and dry. No rash noted. She is not diaphoretic. No erythema. No pallor.  Psychiatric: She has a normal mood and affect. Her behavior is normal. Judgment and thought content normal.  Nursing note and vitals reviewed.   Appointment on 11/27/2019  Component Date Value Ref Range Status  . WBC 11/27/2019 6.8  4.0 - 10.5 K/uL Final  . RBC 11/27/2019 4.29  3.87 - 5.11 MIL/uL Final  . Hemoglobin 11/27/2019 12.8  12.0 - 15.0 g/dL Final  . HCT 11/27/2019 38.0  36.0 - 46.0 % Final  . MCV 11/27/2019 88.6  80.0 - 100.0 fL Final  . MCH 11/27/2019 29.8  26.0 - 34.0 pg Final  . MCHC 11/27/2019 33.7  30.0 - 36.0 g/dL Final  . RDW 11/27/2019 14.1  11.5 - 15.5 % Final  . Platelets 11/27/2019 302  150 - 400 K/uL Final  . nRBC 11/27/2019 0.0  0.0 - 0.2 % Final  . Neutrophils Relative % 11/27/2019 62  % Final  . Neutro Abs 11/27/2019 4.3  1.7 - 7.7 K/uL Final  . Lymphocytes Relative 11/27/2019 20  % Final  . Lymphs Abs 11/27/2019 1.4  0.7 - 4.0 K/uL Final  . Monocytes Relative 11/27/2019 10  % Final  . Monocytes Absolute 11/27/2019 0.7  0.1 - 1.0 K/uL Final  . Eosinophils Relative 11/27/2019 5  % Final  . Eosinophils Absolute 11/27/2019 0.3  0.0 - 0.5 K/uL Final  . Basophils Relative 11/27/2019 1  % Final  . Basophils Absolute 11/27/2019 0.1  0.0 - 0.1 K/uL Final  . Immature Granulocytes 11/27/2019 2  % Final  . Abs Immature Granulocytes 11/27/2019 0.10* 0.00 - 0.07 K/uL Final   Performed at [facility], [address], [place], [place] [address]    Assessment:  SYMPHANIE CEDERBERG is a 69 y.o. female with stage IC left breast cancers/p left partial  mastectomy on 06/08/2010. Pathology revealed a 1.1 cm grade I invasive carcinoma. 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 The more superficial smaller carcinoma extended to the inked margin at the caudal aspect in an area of 3 mm in linear extent. One sentinel lymph node was negative. DCIS was present. She underwent re-excision for a positive margin. Tumor was ER+, PR+, Her2/neu -. Pathologic stage was [id number].  She received radiationfrom 07/06/2010 - 08/19/2010. She was on Arimidexfrom 09/17/2010 - 03/19/2016.  Bilateral mammogramon 12/16/2015 revealed no evidence of malignancy. Bilateral mammogram at [facility] 01/26/2017 revealed no evidence of malignancy. [facility] on 07/11/2019revealed no evidence of malignancy. Diagnostic mammogram on 02/05/2019 at [facility] revealed no evidence of malignancy.   CA27.29has been followed: 24.1 on 02/18/2011, 20.9 on 09/09/2011, 24.7 on 05/11/2012, 21.7 on 06/19/2013, 16.4 on 02/20/2016, 16.1 on 03/28/2017, 13.4 on 03/30/2018, and 27.9 on 11/28/2018.  BCI testing on 03/02/2016 revealed a 3.6% risk of late recurrence between years 5 through 10. The likelihood of benefit of extended endocrine therapy is low.  Bone density on 03/15/2016 revealed osteoporosiswith a T score of -2.7 in the left hip. Bone density on 03/14/2018 revealed osteopeniawith a T-score of -2.0 in the right femur. She has received Proliaevery 6 months (03/30/2016 - 05/29/2019).   She has iron deficiency anemia s/p gastric bypass surgery (07/11/2014).  Ferritin was 6 with an iron saturation of 9% and a TIBC of 485 on 09/26/2018.  Diet is good.  She denies any bleeding.   She received Venofer x 4 (06/05/2019 - 06/25/2019).   Ferritin has been followed: 7 on 05/29/2019 and 46 on 08/28/2019.  Colonoscopy on 03/17/2016 revealed 2 diminutive polyps in the proximal ascending colon and in the cecum  (tubular adenomas).  She has chronic hyponatremia. She is on spironolactone and Lasix.  She had her last COVID-19 vaccine on 09/07/2019.   Symptomatically, she feels good.  She denies any breast concerns.  She denies any bleeding.  Exam is stable.  Plan: 1.   Labs today: CBC with diff, CMP, CA 27.29, ferritin, iron studies. 2.   Stage IC left breast cancer Clinically, she is doing well.  Exam reveals no evidence of recurrent disease.  Mammogram on 02/05/2019 revealed scattered fibroglandular tissue in both breasts and no evidence of malignancy.  Next annual mammogram due on 02/05/2020.     Patient wishes to have performed at [facility] (facilitate).    Continue ongoing surveillance. 3.   Osteopenia Bone density improved from osteoporosis to osteopenia with Prolia. Continue calcium and vitamin D.             Labs reviewed.  Prolia today..             Continue Prolia every 6 months.  Schedule bone density on 03/14/2020. 4.  Iron deficiency anemia s/p gastric bypass surgery             Patient is s/p gastric bypass surgery on 07/11/2014.             Colonoscopy on 03/17/2016 revealed 2 diminutive polyps in the proximal ascending colon and in the cecum (tubular adenomas).             Hematocrit 38.0.  Hemoglobin 12.8.  MCV 88.6.    Ferritin 52 with an iron saturation of 29% and a TIBC of 414.  Diet is good.              She denies any bleeding. 5.   Hyponatremia, chronic Sodium 128.             Follow-up with primary care physician. 6.   RTC in 6 months for MD assessment, labs (CBC with diff, CMP, CA27.29, ferritin, iron studies), Prolia, and +/- Venofer.  I discussed the assessment and treatment plan with the patient.  The patient was provided an opportunity to ask questions and all were answered.  The patient agreed with the plan and demonstrated an understanding of the instructions.  The patient was advised to call  back if the symptoms worsen or if the condition fails to improve as anticipated.   Lequita Asal, MD, PhD    11/27/2019, 8:47 AM  I, Selena Batten, am acting as scribe for [employer]. Mike Gip, MD, PhD.  I, Melissa C. Mike Gip, MD, have reviewed the above documentation for accuracy and completeness, and I agree with the above.

## 2019-11-27 ENCOUNTER — Other Ambulatory Visit: Payer: Self-pay

## 2019-11-27 ENCOUNTER — Inpatient Hospital Stay: Payer: Medicare PPO | Attending: Hematology and Oncology | Admitting: Hematology and Oncology

## 2019-11-27 ENCOUNTER — Inpatient Hospital Stay: Payer: Medicare PPO

## 2019-11-27 ENCOUNTER — Encounter: Payer: Self-pay | Admitting: Hematology and Oncology

## 2019-11-27 ENCOUNTER — Other Ambulatory Visit: Payer: Self-pay | Admitting: Hematology and Oncology

## 2019-11-27 ENCOUNTER — Telehealth: Payer: Self-pay

## 2019-11-27 VITALS — BP 118/78 | HR 70 | Resp 18

## 2019-11-27 VITALS — BP 134/62 | HR 68 | Temp 97.1°F | Resp 16 | Wt 167.4 lb

## 2019-11-27 DIAGNOSIS — C50912 Malignant neoplasm of unspecified site of left female breast: Secondary | ICD-10-CM

## 2019-11-27 DIAGNOSIS — M85851 Other specified disorders of bone density and structure, right thigh: Secondary | ICD-10-CM

## 2019-11-27 DIAGNOSIS — K9589 Other complications of other bariatric procedure: Secondary | ICD-10-CM

## 2019-11-27 DIAGNOSIS — D509 Iron deficiency anemia, unspecified: Secondary | ICD-10-CM

## 2019-11-27 DIAGNOSIS — Z79899 Other long term (current) drug therapy: Secondary | ICD-10-CM | POA: Insufficient documentation

## 2019-11-27 DIAGNOSIS — Z170 Estrogen receptor positive status [ER+]: Secondary | ICD-10-CM

## 2019-11-27 DIAGNOSIS — D508 Other iron deficiency anemias: Secondary | ICD-10-CM

## 2019-11-27 LAB — CBC WITH DIFFERENTIAL/PLATELET
Abs Immature Granulocytes: 0.1 10*3/uL — ABNORMAL HIGH (ref 0.00–0.07)
Basophils Absolute: 0.1 10*3/uL (ref 0.0–0.1)
Basophils Relative: 1 %
Eosinophils Absolute: 0.3 10*3/uL (ref 0.0–0.5)
Eosinophils Relative: 5 %
HCT: 38 % (ref 36.0–46.0)
Hemoglobin: 12.8 g/dL (ref 12.0–15.0)
Immature Granulocytes: 2 %
Lymphocytes Relative: 20 %
Lymphs Abs: 1.4 10*3/uL (ref 0.7–4.0)
MCH: 29.8 pg (ref 26.0–34.0)
MCHC: 33.7 g/dL (ref 30.0–36.0)
MCV: 88.6 fL (ref 80.0–100.0)
Monocytes Absolute: 0.7 10*3/uL (ref 0.1–1.0)
Monocytes Relative: 10 %
Neutro Abs: 4.3 10*3/uL (ref 1.7–7.7)
Neutrophils Relative %: 62 %
Platelets: 302 10*3/uL (ref 150–400)
RBC: 4.29 MIL/uL (ref 3.87–5.11)
RDW: 14.1 % (ref 11.5–15.5)
WBC: 6.8 10*3/uL (ref 4.0–10.5)
nRBC: 0 % (ref 0.0–0.2)

## 2019-11-27 LAB — COMPREHENSIVE METABOLIC PANEL
ALT: 21 U/L (ref 0–44)
AST: 25 U/L (ref 15–41)
Albumin: 4.3 g/dL (ref 3.5–5.0)
Alkaline Phosphatase: 42 U/L (ref 38–126)
Anion gap: 10 (ref 5–15)
BUN: 21 mg/dL (ref 8–23)
CO2: 25 mmol/L (ref 22–32)
Calcium: 9.1 mg/dL (ref 8.9–10.3)
Chloride: 93 mmol/L — ABNORMAL LOW (ref 98–111)
Creatinine, Ser: 1 mg/dL (ref 0.44–1.00)
GFR calc Af Amer: >60 mL/min (ref >60)
GFR calc non Af Amer: 57 mL/min — ABNORMAL LOW (ref >60)
Glucose, Bld: 94 mg/dL (ref 70–99)
Potassium: 3.9 mmol/L (ref 3.5–5.1)
Sodium: 128 mmol/L — ABNORMAL LOW (ref 135–145)
Total Bilirubin: 0.6 mg/dL (ref 0.3–1.2)
Total Protein: 7.3 g/dL (ref 6.5–8.1)

## 2019-11-27 LAB — IRON AND TIBC
Iron: 119 ug/dL (ref 28–170)
Saturation Ratios: 29 % (ref 10.4–31.8)
TIBC: 414 ug/dL (ref 250–450)
UIBC: 295 ug/dL

## 2019-11-27 LAB — FERRITIN: Ferritin: 52 ng/mL (ref 11–307)

## 2019-11-27 MED ORDER — SODIUM CHLORIDE 0.9 % IV SOLN
Freq: Once | INTRAVENOUS | Status: AC
  Filled 2019-11-27: qty 250

## 2019-11-27 MED ORDER — ZOLEDRONIC ACID 4 MG/100ML IV SOLN
4.0000 mg | Freq: Once | INTRAVENOUS | Status: AC
  Administered 2019-11-27: 4 mg via INTRAVENOUS

## 2019-11-27 MED ORDER — DENOSUMAB 60 MG/ML ~~LOC~~ SOSY: 60.0000 mg | PREFILLED_SYRINGE | Freq: Once | SUBCUTANEOUS | Status: DC

## 2019-11-27 NOTE — Telephone Encounter (Signed)
Sent todays labs to PCP per Dr Mike Gip

## 2019-11-27 NOTE — Patient Instructions (Signed)

## 2019-11-27 NOTE — Progress Notes (Signed)
Patient states that she is still having bladder pressure (previous UTI diagnosed on 5/3). Finished macrobid. [place] recently hurt knee and hip, starting PT soon. Needs referral for mammo at [facility] in [place].

## 2019-11-28 LAB — CA 27.29 (SERIAL MONITOR): CA 27.29: 25.4 U/mL (ref 0.0–38.6)

## 2020-01-30 ENCOUNTER — Encounter: Payer: Self-pay | Admitting: Hematology and Oncology

## 2020-03-17 ENCOUNTER — Other Ambulatory Visit: Payer: Self-pay

## 2020-03-17 ENCOUNTER — Ambulatory Visit
Admission: RE | Admit: 2020-03-17 | Discharge: 2020-03-17 | Disposition: A | Discharge status: Home / Self Care | Payer: Medicare PPO | Source: Ambulatory Visit | Attending: Hematology and Oncology | Admitting: Hematology and Oncology

## 2020-03-17 DIAGNOSIS — M85851 Other specified disorders of bone density and structure, right thigh: Secondary | ICD-10-CM

## 2020-05-28 ENCOUNTER — Inpatient Hospital Stay: Payer: Medicare PPO | Attending: Hematology and Oncology

## 2020-05-28 ENCOUNTER — Other Ambulatory Visit: Payer: Self-pay

## 2020-05-28 ENCOUNTER — Telehealth: Payer: Self-pay

## 2020-05-28 ENCOUNTER — Encounter: Payer: Self-pay | Admitting: Hematology and Oncology

## 2020-05-28 DIAGNOSIS — Z90710 Acquired absence of both cervix and uterus: Secondary | ICD-10-CM | POA: Insufficient documentation

## 2020-05-28 DIAGNOSIS — M8580 Other specified disorders of bone density and structure, unspecified site: Secondary | ICD-10-CM | POA: Insufficient documentation

## 2020-05-28 DIAGNOSIS — Z853 Personal history of malignant neoplasm of breast: Secondary | ICD-10-CM | POA: Insufficient documentation

## 2020-05-28 DIAGNOSIS — K9589 Other complications of other bariatric procedure: Secondary | ICD-10-CM

## 2020-05-28 DIAGNOSIS — E871 Hypo-osmolality and hyponatremia: Secondary | ICD-10-CM | POA: Diagnosis not present

## 2020-05-28 DIAGNOSIS — D508 Other iron deficiency anemias: Secondary | ICD-10-CM | POA: Insufficient documentation

## 2020-05-28 DIAGNOSIS — Z170 Estrogen receptor positive status [ER+]: Secondary | ICD-10-CM

## 2020-05-28 DIAGNOSIS — Z923 Personal history of irradiation: Secondary | ICD-10-CM | POA: Diagnosis not present

## 2020-05-28 DIAGNOSIS — Z9221 Personal history of antineoplastic chemotherapy: Secondary | ICD-10-CM | POA: Diagnosis not present

## 2020-05-28 DIAGNOSIS — Z800 Family history of malignant neoplasm of digestive organs: Secondary | ICD-10-CM | POA: Insufficient documentation

## 2020-05-28 DIAGNOSIS — Z9884 Bariatric surgery status: Secondary | ICD-10-CM | POA: Diagnosis not present

## 2020-05-28 DIAGNOSIS — Z79899 Other long term (current) drug therapy: Secondary | ICD-10-CM | POA: Insufficient documentation

## 2020-05-28 DIAGNOSIS — C50912 Malignant neoplasm of unspecified site of left female breast: Secondary | ICD-10-CM

## 2020-05-28 DIAGNOSIS — M85851 Other specified disorders of bone density and structure, right thigh: Secondary | ICD-10-CM

## 2020-05-28 LAB — CBC WITH DIFFERENTIAL/PLATELET
Abs Immature Granulocytes: 0.08 10*3/uL — ABNORMAL HIGH (ref 0.00–0.07)
Basophils Absolute: 0 10*3/uL (ref 0.0–0.1)
Basophils Relative: 1 %
Eosinophils Absolute: 0.2 10*3/uL (ref 0.0–0.5)
Eosinophils Relative: 4 %
HCT: 34.9 % — ABNORMAL LOW (ref 36.0–46.0)
Hemoglobin: 11.9 g/dL — ABNORMAL LOW (ref 12.0–15.0)
Immature Granulocytes: 1 %
Lymphocytes Relative: 17 %
Lymphs Abs: 1 10*3/uL (ref 0.7–4.0)
MCH: 30.2 pg (ref 26.0–34.0)
MCHC: 34.1 g/dL (ref 30.0–36.0)
MCV: 88.6 fL (ref 80.0–100.0)
Monocytes Absolute: 0.7 10*3/uL (ref 0.1–1.0)
Monocytes Relative: 12 %
Neutro Abs: 4.1 10*3/uL (ref 1.7–7.7)
Neutrophils Relative %: 65 %
Platelets: 293 10*3/uL (ref 150–400)
RBC: 3.94 MIL/uL (ref 3.87–5.11)
RDW: 14.3 % (ref 11.5–15.5)
WBC: 6.2 10*3/uL (ref 4.0–10.5)
nRBC: 0 % (ref 0.0–0.2)

## 2020-05-28 LAB — COMPREHENSIVE METABOLIC PANEL
ALT: 21 U/L (ref 0–44)
AST: 28 U/L (ref 15–41)
Albumin: 4 g/dL (ref 3.5–5.0)
Alkaline Phosphatase: 48 U/L (ref 38–126)
Anion gap: 12 (ref 5–15)
BUN: 21 mg/dL (ref 8–23)
CO2: 24 mmol/L (ref 22–32)
Calcium: 9 mg/dL (ref 8.9–10.3)
Chloride: 91 mmol/L — ABNORMAL LOW (ref 98–111)
Creatinine, Ser: 0.93 mg/dL (ref 0.44–1.00)
GFR, Estimated: >60 mL/min (ref >60)
Glucose, Bld: 116 mg/dL — ABNORMAL HIGH (ref 70–99)
Potassium: 4.7 mmol/L (ref 3.5–5.1)
Sodium: 127 mmol/L — ABNORMAL LOW (ref 135–145)
Total Bilirubin: 0.8 mg/dL (ref 0.3–1.2)
Total Protein: 6.7 g/dL (ref 6.5–8.1)

## 2020-05-28 LAB — FERRITIN: Ferritin: 40 ng/mL (ref 11–307)

## 2020-05-28 LAB — IRON AND TIBC
Iron: 127 ug/dL (ref 28–170)
Saturation Ratios: 31 % (ref 10.4–31.8)
TIBC: 405 ug/dL (ref 250–450)
UIBC: 278 ug/dL

## 2020-05-28 NOTE — Telephone Encounter (Signed)
-----   Message from Lequita Asal, MD sent at 05/28/2020  1:05 PM EST ----- Regarding: Please notify patient of sodium and send labs to PCP  ----- Message ----- From: Interface, Lab In [place] Sent: 05/28/2020  10:06 AM EST To: Lequita Asal, MD

## 2020-05-28 NOTE — Telephone Encounter (Signed)
Labs routed. Called patient to let her know, no answer, left message to call back

## 2020-05-28 NOTE — Telephone Encounter (Signed)
The patient labs was sent to the PCP office today and the patient was informed.

## 2020-05-28 NOTE — Progress Notes (Signed)
Patient was called for pre assessment. She denied any pain or concerns at this time.

## 2020-05-28 NOTE — Progress Notes (Signed)
[facility]  [address] [place], [place] [address] Phone: [phone]  Fax: [phone]   Clinic Day:  05/29/2020  Referring physician: Dereck Ligas, DO  Chief Complaint: Amanda Downs is a 69 y.o. female with stage IC left breast cancer who is seen for 6 month assessment.  HPI: The patient was last seen in the medical oncology clinic on 11/27/2019. At that time, she felt good.  She denied any breast concerns.  She denied any bleeding.  Exam was stable.  Hematocrit was 38.0, hemoglobin 12.8, platelets 302,000, WBC 6,800. Sodium was 128. Creatinine was 1.00 (CrCl 57 ml/min). Ferritin was 52 with an iron saturation of 29% and a TIBC of 414. CA27.29 was 25.4.  We discussed ongoing surveillance. She continued calcium and vitamin D.  She received Zometa.   Diagnostic mammogram at [facility] on 01/30/2020 revealed no evidence of malignancy.  Bone density on 03/17/2020 revealed osteopenia with a T score of -2.1 at the total right femur.  During the interim, she has been good. She has had 4 styes over the past year. She has had one in her right eye and three in her left. Her dermatologist started her on doxycycline. Her diet is okay. She has acid reflux and constipation and takes Linzess. She states that she has some family problems so her symptoms may be due to stress.  She describes headaches and fatigue after receiving Zometa. These symptoms did not occur with Prolia. She would like to see if she can receive Prolia again.   Past Medical History:  Diagnosis Date  . Bariatric surgery status   . Cancer (HCC)    BREAST  . CHF (congestive heart failure) ([place])   . Diabetes mellitus without complication ([place])   . GERD (gastroesophageal reflux disease)   . Hyperlipemia   . Hypertension   . Mitral valve disorder   . Postsurgical malabsorption     Past Surgical History:  Procedure Laterality Date  . ABDOMINAL HYSTERECTOMY    . BREAST SURGERY Left   .  COLONOSCOPY WITH PROPOFOL N/A 03/17/2016   Procedure: COLONOSCOPY WITH PROPOFOL;  Surgeon: Manya Silvas, MD;  Location: [facility] ENDOSCOPY;  Service: Endoscopy;  Laterality: N/A;  . HERNIA REPAIR    . ROUX-EN-Y GASTRIC BYPASS    . TONSILLECTOMY    . TUBAL LIGATION      Family History  Problem Relation Age of Onset  . Colon cancer Mother   . Stomach cancer Mother     Social History:  reports that she is a non-smoker but has been exposed to tobacco smoke. She has never used smokeless tobacco. She reports that she does not drink alcohol and does not use drugs.  She is back to work as a Programmer, multimedia.  She lives in [place]. The patient is alone today.  Allergies:  Allergies  Allergen Reactions  . Lipitor [Atorvastatin] Other (See Comments)  . Rosuvastatin     Other reaction(s): Muscle Pain  . Simvastatin Other (See Comments)    Current Medications: Current Outpatient Medications  Medication Sig Dispense Refill  . carvedilol (COREG) 6.25 MG tablet Take 6.25 mg by mouth 2 (two) times daily.    . cyanocobalamin ([employer]S VITAMIN B12) 2000 MCG tablet Take by mouth as needed.     . desonide (DESOWEN) 0.05 % cream APPLY TO SCALY AREA RIGHT OF EAR TWO TIMES DAILY FOR 7 DAYS MAX    . doxycycline (PERIOSTAT) 20 MG tablet Take 20 mg by mouth 2 (  two) times daily.    Marland Kitchen esomeprazole (NEXIUM 24HR) 20 MG capsule take 1 capsule as needed    . ezetimibe (ZETIA) 10 MG tablet Take 10 mg by mouth daily.     . furosemide (LASIX) 40 MG tablet Take 40 mg by mouth daily.     Marland Kitchen levocetirizine (XYZAL) 5 MG tablet Take 5 mg by mouth every evening.     Marland Kitchen LINZESS 145 MCG CAPS capsule     . magnesium oxide (MAG-OX) 400 MG tablet Take 400 mg by mouth daily.     . Melatonin 3 MG TABS Take 1 tablet by mouth at bedtime.     . metFORMIN (GLUCOPHAGE) 500 MG tablet Take 500 mg by mouth 2 (two) times daily with a meal.     . Omega-3 Fatty Acids (FISH OIL) 1000 MG CAPS Take 1 capsule by mouth as needed.     . ramipril  (ALTACE) 5 MG capsule Take 5 mg by mouth 2 (two) times daily.     Marland Kitchen saccharomyces boulardii (FLORASTOR) 250 MG capsule Take 250 mg by mouth daily.     Marland Kitchen spironolactone (ALDACTONE) 25 MG tablet Take 25 mg by mouth daily.     Marland Kitchen triamcinolone (NASACORT) 55 MCG/ACT AERO nasal inhaler Place 1 spray into the nose daily.     . Triamcinolone Acetonide (CVS NASAL ALLERGY SPRAY NA) SMARTSIG:2 Spray(s) Both Nares Daily    . Vitamin D, Cholecalciferol, 400 UNITS CAPS Take 1 capsule by mouth daily.      No current facility-administered medications for this visit.   Review of Systems  Constitutional: Positive for weight loss (6 lbs). Negative for chills, diaphoresis, fever and malaise/fatigue.  HENT: Negative.  Negative for congestion, ear discharge, ear pain, hearing loss, nosebleeds, sinus pain, sore throat and tinnitus.   Eyes: Negative for blurred vision, double vision and photophobia.       Four styes over the past year.  Respiratory: Negative.  Negative for cough, hemoptysis, sputum production, shortness of breath and wheezing.   Cardiovascular: Negative for chest pain, palpitations, orthopnea and leg swelling.  Gastrointestinal: Positive for constipation (on Linzess) and heartburn. Negative for abdominal pain, blood in stool, diarrhea, melena, nausea and vomiting.       Diet is okay.  Genitourinary: Negative.  Negative for dysuria, frequency, hematuria and urgency.  Musculoskeletal: Negative.  Negative for back pain, joint pain, myalgias and neck pain.  Skin: Negative.  Negative for itching and rash.  Neurological: Negative.  Negative for dizziness, tingling, sensory change, speech change, focal weakness, weakness and headaches.  Endo/Heme/Allergies: Negative.  Does not bruise/bleed easily.  Psychiatric/Behavioral: Negative for depression and memory loss. The patient is not nervous/anxious and does not have insomnia.        Stressed due to family problems.  All other systems reviewed and are  negative.  Performance status (ECOG): 1  Vitals Blood pressure 127/74, pulse 67, temperature (!) 97.3 F (36.3 C), temperature source Tympanic, resp. rate 18, weight 161 lb 6 oz (73.2 kg), SpO2 100 %.  Physical Exam Vitals and nursing note reviewed.  Constitutional:      General: She is not in acute distress.    Appearance: She is well-developed. She is not diaphoretic.  HENT:     Head: Normocephalic and atraumatic.     Comments: Short hair.    Mouth/Throat:     Mouth: Mucous membranes are moist.     Pharynx: Oropharynx is clear. No oropharyngeal exudate.  Eyes:  General: No scleral icterus.    Extraocular Movements: Extraocular movements intact.     Conjunctiva/sclera: Conjunctivae normal.     Pupils: Pupils are equal, round, and reactive to light.     Comments: Blue eyes.   Neck:     Vascular: No JVD.  Cardiovascular:     Rate and Rhythm: Normal rate and regular rhythm.     Heart sounds: Normal heart sounds. No murmur heard.  No friction rub. No gallop.   Pulmonary:     Effort: Pulmonary effort is normal. No respiratory distress.     Breath sounds: Normal breath sounds. No wheezing or rales.  Chest:     Chest wall: No tenderness.     Breasts:        Right: Tenderness (mild) present. No inverted nipple, mass, nipple discharge or skin change.        Left: Swelling (s/p radiation), skin change (scarring at 5 o clock) and tenderness present. No inverted nipple, mass or nipple discharge.  Abdominal:     General: Bowel sounds are normal. There is no distension.     Palpations: Abdomen is soft. There is no mass.     Tenderness: There is no abdominal tenderness. There is no guarding or rebound.  Musculoskeletal:        General: No swelling or tenderness. Normal range of motion.     Cervical back: Normal range of motion and neck supple.     Right lower leg: No edema.     Left lower leg: No edema.  Lymphadenopathy:     Head:     Right side of head: No preauricular,  posterior auricular or occipital adenopathy.     Left side of head: No preauricular, posterior auricular or occipital adenopathy.     Cervical: No cervical adenopathy.     Upper Body:     Right upper body: No supraclavicular or axillary adenopathy.     Left upper body: No supraclavicular or axillary adenopathy.     Lower Body: No right inguinal adenopathy. No left inguinal adenopathy.  Skin:    General: Skin is warm and dry.     Coloration: Skin is not pale.     Findings: No erythema or rash.  Neurological:     Mental Status: She is alert and oriented to person, place, and time.  Psychiatric:        Behavior: Behavior normal.        Thought Content: Thought content normal.        Judgment: Judgment normal.    Appointment on 05/28/2020  Component Date Value Ref Range Status  . Iron 05/28/2020 127  28 - 170 ug/dL Final  . TIBC 05/28/2020 405  250 - 450 ug/dL Final  . Saturation Ratios 05/28/2020 31  10.4 - 31.8 % Final  . UIBC 05/28/2020 278  ug/dL Final   Performed at [facility], [address] [place], [place] [address]  . Ferritin 05/28/2020 40  11 - 307 ng/mL Final   Performed at [facility], [address] [place], [place] [address]  . CA 27.29 05/28/2020 13.1  0.0 - 38.6 U/mL Final   Comment: (NOTE) Siemens Centaur Immunochemiluminometric Methodology [facility]) Values obtained with different assay methods or kits cannot be used interchangeably. Results cannot be interpreted as absolute evidence of the presence or absence of malignant disease. Performed At: [facility] [place], [place] [id number] Rush Farmer MD FY:[id number]   . Sodium 05/28/2020 127* 135 -  145 mmol/L Final  . Potassium 05/28/2020 4.7  3.5 - 5.1 mmol/L Final  . Chloride 05/28/2020 91* 98 - 111 mmol/L Final  . CO2 05/28/2020 24  22 - 32 mmol/L Final  . Glucose, Bld 05/28/2020 116* 70 - 99 mg/dL Final   Glucose reference range applies only to samples taken  after fasting for at least 8 hours.  . BUN 05/28/2020 21  8 - 23 mg/dL Final  . Creatinine, Ser 05/28/2020 0.93  0.44 - 1.00 mg/dL Final  . Calcium 05/28/2020 9.0  8.9 - 10.3 mg/dL Final  . Total Protein 05/28/2020 6.7  6.5 - 8.1 g/dL Final  . Albumin 05/28/2020 4.0  3.5 - 5.0 g/dL Final  . AST 05/28/2020 28  15 - 41 U/L Final  . ALT 05/28/2020 21  0 - 44 U/L Final  . Alkaline Phosphatase 05/28/2020 48  38 - 126 U/L Final  . Total Bilirubin 05/28/2020 0.8  0.3 - 1.2 mg/dL Final  . GFR, Estimated 05/28/2020 >60  >60 mL/min Final   Comment: (NOTE) Calculated using the CKD-EPI Creatinine Equation (2021)   . Anion gap 05/28/2020 12  5 - 15 Final   Performed at [facility], [address] [place], [place] [address]  . WBC 05/28/2020 6.2  4.0 - 10.5 K/uL Final  . RBC 05/28/2020 3.94  3.87 - 5.11 MIL/uL Final  . Hemoglobin 05/28/2020 11.9* 12.0 - 15.0 g/dL Final  . HCT 05/28/2020 34.9* 36 - 46 % Final  . MCV 05/28/2020 88.6  80.0 - 100.0 fL Final  . MCH 05/28/2020 30.2  26.0 - 34.0 pg Final  . MCHC 05/28/2020 34.1  30.0 - 36.0 g/dL Final  . RDW 05/28/2020 14.3  11.5 - 15.5 % Final  . Platelets 05/28/2020 293  150 - 400 K/uL Final  . nRBC 05/28/2020 0.0  0.0 - 0.2 % Final  . Neutrophils Relative % 05/28/2020 65  % Final  . Neutro Abs 05/28/2020 4.1  1.7 - 7.7 K/uL Final  . Lymphocytes Relative 05/28/2020 17  % Final  . Lymphs Abs 05/28/2020 1.0  0.7 - 4.0 K/uL Final  . Monocytes Relative 05/28/2020 12  % Final  . Monocytes Absolute 05/28/2020 0.7  0.1 - 1.0 K/uL Final  . Eosinophils Relative 05/28/2020 4  % Final  . Eosinophils Absolute 05/28/2020 0.2  0.0 - 0.5 K/uL Final  . Basophils Relative 05/28/2020 1  % Final  . Basophils Absolute 05/28/2020 0.0  0.0 - 0.1 K/uL Final  . Immature Granulocytes 05/28/2020 1  % Final  . Abs Immature Granulocytes 05/28/2020 0.08* 0.00 - 0.07 K/uL Final   Performed at [facility], [address], [place], [place] [address]     Assessment:  Amanda Downs is a 69 y.o. female with stage IC left breast cancers/p left partial mastectomy on 06/08/2010. Pathology revealed a 1.1 cm grade I invasive carcinoma. 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 The more superficial smaller carcinoma extended to the inked margin at the caudal aspect in an area of 3 mm in linear extent. One sentinel lymph node was negative. DCIS was present. She underwent re-excision for a positive margin. Tumor was ER+, PR+, Her2/neu -. Pathologic stage was [id number].  She received radiationfrom 07/06/2010 - 08/19/2010. She was on Arimidexfrom 09/17/2010 - 03/19/2016.  Bilateral mammogramon 12/16/2015 revealed no evidence of malignancy. Bilateral mammogram at [facility] 01/26/2017 revealed no evidence of malignancy. Bilateral [facility] on  07/11/2019revealed no evidence of malignancy. Diagnostic mammogram on 02/05/2019 at [facility] revealed no evidence of malignancy.  Diagnostic mammogram at [facility] on 01/30/2020 revealed no evidence of malignancy.  CA27.29has been followed: 24.1 on 02/18/2011, 20.9 on 09/09/2011, 24.7 on 05/11/2012, 21.7 on 06/19/2013, 16.4 on 02/20/2016, 16.1 on 03/28/2017, 13.4 on 03/30/2018, 27.9 on 11/28/2018, 19.6 on 05/29/2019, 25.4 on 11/27/2019, and 13.1 on 05/28/2020.  BCI testing on 03/02/2016 revealed a 3.6% risk of late recurrence between years 5 through 10. The likelihood of benefit of extended endocrine therapy is low.  Bone density on 03/15/2016 revealed osteoporosiswith a T score of -2.7 in the left hip. Bone density on 03/14/2018 revealed osteopeniawith a T-score of -2.0 in the right femur. Bone density on 03/17/2020 revealed osteopenia with a T score of -2.1 at the total right femur.  She received Proliaevery 6 months (03/30/2016 - 05/29/2019). She received Zometa on 11/27/2019.   She has iron deficiency anemia s/p gastric bypass  surgery (07/11/2014).  Ferritin was 6 with an iron saturation of 9% and a TIBC of 485 on 09/26/2018.  Diet is good.  She denies any bleeding.   She received Venofer x 4 (06/05/2019 - 06/25/2019).   Ferritin has been followed: 7 on 05/29/2019, 46 on 08/28/2019, 52 on 11/27/2019, and 40 on 05/28/2020.  Colonoscopy on 03/17/2016 revealed 2 diminutive polyps in the proximal ascending colon and in the cecum (tubular adenomas).  She has chronic hyponatremia. She is on spironolactone and Lasix.  She had her last COVID-19 vaccine on 09/07/2019.   Symptomatically, she feels "good".  She has had 4 styes in the past year.  She has side effects when she received Zometa 6 months ago.  Exam is stable.  Plan: 1.   Review labs from 05/28/2020. 2.   Stage IC left breast cancer Clinically, she is doing well.  Exam remains stable without evidence of recurrent disease.  Diagnostic mammogram at [facility] on 01/30/2020 revealed no evidence of malignancy.    CA 27-29 is normal.  Continue surveillance. 3.   Osteopenia Bone density on 03/17/2020 confirm persistent osteopenia. She is on calcium and vitamin D.             She has tolerated Prolia well in the past.             She received Zometa on 11/27/2019 per insurance coverage.   She experienced headaches and fatigue post infusion.   She would like to return to Prolia every 6 months.   Postpone Zometa today.  Continue to monitor. 4.  Iron deficiency anemia s/p gastric bypass surgery             Patient is s/p gastric bypass surgery on 07/11/2014.             Colonoscopy on 03/17/2016 revealed 2 diminutive tubular adenomas.             Hematocrit 34.9.  Hemoglobin 11.9.  MCV 88.6.    Ferritin 40 with an iron saturation of 31% and a TIBC of 405.             She denies any bleeding.              Continue to monitor. 5.   Hyponatremia, chronic Sodium 127.            Labs reviewed with patient. She will  follow up with her primary care physician. 6.   No Zometa. 7.   Submit an appeal for Prolia. 8.  RN to contact patient for labs (BMP) and Prolia (or Zometa) based on appeal. 9.   RTC in 6 months (after Prolia or Zometa) for MD assessment, labs (CBC with diff, CMP, CA27.29), and +/- Prolia (or Zometa).  I discussed the assessment and treatment plan with the patient.  The patient was provided an opportunity to ask questions and all were answered.  The patient agreed with the plan and demonstrated an understanding of the instructions.  The patient was advised to call back if the symptoms worsen or if the condition fails to improve as anticipated.   Lequita Asal, MD, PhD    05/29/2020, 11:09 AM  I, Mirian Mo Tufford, am acting as Education administrator for [employer]. Mike Gip, MD, PhD.  I, Rhyder Koegel C. Mike Gip, MD, have reviewed the above documentation for accuracy and completeness, and I agree with the above.

## 2020-05-29 ENCOUNTER — Encounter: Payer: Self-pay | Admitting: Hematology and Oncology

## 2020-05-29 ENCOUNTER — Inpatient Hospital Stay: Payer: Medicare PPO

## 2020-05-29 ENCOUNTER — Inpatient Hospital Stay: Payer: Medicare PPO | Admitting: Hematology and Oncology

## 2020-05-29 VITALS — BP 127/74 | HR 67 | Temp 97.3°F | Resp 18 | Wt 161.4 lb

## 2020-05-29 DIAGNOSIS — C50912 Malignant neoplasm of unspecified site of left female breast: Secondary | ICD-10-CM

## 2020-05-29 DIAGNOSIS — M8580 Other specified disorders of bone density and structure, unspecified site: Secondary | ICD-10-CM | POA: Diagnosis not present

## 2020-05-29 DIAGNOSIS — E871 Hypo-osmolality and hyponatremia: Secondary | ICD-10-CM | POA: Insufficient documentation

## 2020-05-29 DIAGNOSIS — K9589 Other complications of other bariatric procedure: Secondary | ICD-10-CM

## 2020-05-29 DIAGNOSIS — Z170 Estrogen receptor positive status [ER+]: Secondary | ICD-10-CM

## 2020-05-29 DIAGNOSIS — D508 Other iron deficiency anemias: Secondary | ICD-10-CM

## 2020-05-29 DIAGNOSIS — M85851 Other specified disorders of bone density and structure, right thigh: Secondary | ICD-10-CM

## 2020-05-29 LAB — CANCER ANTIGEN 27.29: CA 27.29: 13.1 U/mL (ref 0.0–38.6)

## 2020-06-02 ENCOUNTER — Telehealth: Payer: Self-pay

## 2020-06-02 NOTE — Telephone Encounter (Signed)
Spoke with the patient to inform her about her lab reults ( sodium 127) The patient was understanding and agreeable. I also inform her that her labs was sent to her PCO office.

## 2020-06-17 ENCOUNTER — Other Ambulatory Visit: Payer: Self-pay

## 2020-06-17 ENCOUNTER — Inpatient Hospital Stay: Payer: Medicare PPO

## 2020-06-17 VITALS — BP 127/79 | HR 74 | Temp 97.5°F | Resp 18

## 2020-06-17 DIAGNOSIS — Z170 Estrogen receptor positive status [ER+]: Secondary | ICD-10-CM

## 2020-06-17 DIAGNOSIS — M85851 Other specified disorders of bone density and structure, right thigh: Secondary | ICD-10-CM

## 2020-06-17 DIAGNOSIS — C50912 Malignant neoplasm of unspecified site of left female breast: Secondary | ICD-10-CM

## 2020-06-17 DIAGNOSIS — M8580 Other specified disorders of bone density and structure, unspecified site: Secondary | ICD-10-CM | POA: Diagnosis not present

## 2020-06-17 DIAGNOSIS — M810 Age-related osteoporosis without current pathological fracture: Secondary | ICD-10-CM

## 2020-06-17 DIAGNOSIS — D508 Other iron deficiency anemias: Secondary | ICD-10-CM

## 2020-06-17 DIAGNOSIS — K9589 Other complications of other bariatric procedure: Secondary | ICD-10-CM

## 2020-06-17 DIAGNOSIS — E871 Hypo-osmolality and hyponatremia: Secondary | ICD-10-CM

## 2020-06-17 LAB — COMPREHENSIVE METABOLIC PANEL
ALT: 19 U/L (ref 0–44)
AST: 27 U/L (ref 15–41)
Albumin: 4.2 g/dL (ref 3.5–5.0)
Alkaline Phosphatase: 51 U/L (ref 38–126)
Anion gap: 12 (ref 5–15)
BUN: 13 mg/dL (ref 8–23)
CO2: 23 mmol/L (ref 22–32)
Calcium: 9.1 mg/dL (ref 8.9–10.3)
Chloride: 89 mmol/L — ABNORMAL LOW (ref 98–111)
Creatinine, Ser: 1.01 mg/dL — ABNORMAL HIGH (ref 0.44–1.00)
GFR, Estimated: 60 mL/min — ABNORMAL LOW (ref >60)
Glucose, Bld: 130 mg/dL — ABNORMAL HIGH (ref 70–99)
Potassium: 3.7 mmol/L (ref 3.5–5.1)
Sodium: 124 mmol/L — ABNORMAL LOW (ref 135–145)
Total Bilirubin: 0.8 mg/dL (ref 0.3–1.2)
Total Protein: 6.8 g/dL (ref 6.5–8.1)

## 2020-06-17 LAB — CBC WITH DIFFERENTIAL/PLATELET
Abs Immature Granulocytes: 0.1 10*3/uL — ABNORMAL HIGH (ref 0.00–0.07)
Basophils Absolute: 0 10*3/uL (ref 0.0–0.1)
Basophils Relative: 1 %
Eosinophils Absolute: 0.1 10*3/uL (ref 0.0–0.5)
Eosinophils Relative: 2 %
HCT: 35.8 % — ABNORMAL LOW (ref 36.0–46.0)
Hemoglobin: 12.3 g/dL (ref 12.0–15.0)
Immature Granulocytes: 1 %
Lymphocytes Relative: 16 %
Lymphs Abs: 1.3 10*3/uL (ref 0.7–4.0)
MCH: 30.3 pg (ref 26.0–34.0)
MCHC: 34.4 g/dL (ref 30.0–36.0)
MCV: 88.2 fL (ref 80.0–100.0)
Monocytes Absolute: 0.6 10*3/uL (ref 0.1–1.0)
Monocytes Relative: 7 %
Neutro Abs: 5.9 10*3/uL (ref 1.7–7.7)
Neutrophils Relative %: 73 %
Platelets: 312 10*3/uL (ref 150–400)
RBC: 4.06 MIL/uL (ref 3.87–5.11)
RDW: 14.1 % (ref 11.5–15.5)
WBC: 8 10*3/uL (ref 4.0–10.5)
nRBC: 0 % (ref 0.0–0.2)

## 2020-06-17 MED ORDER — DENOSUMAB 60 MG/ML ~~LOC~~ SOSY
60.0000 mg | PREFILLED_SYRINGE | Freq: Once | SUBCUTANEOUS | Status: AC
  Administered 2020-06-17: 60 mg via SUBCUTANEOUS

## 2020-06-18 ENCOUNTER — Telehealth: Payer: Self-pay

## 2020-06-18 LAB — CANCER ANTIGEN 27.29: CA 27.29: 17.1 U/mL (ref 0.0–38.6)

## 2020-06-18 LAB — IRON AND TIBC
Iron: 130 ug/dL (ref 28–170)
Saturation Ratios: 32 % — ABNORMAL HIGH (ref 10.4–31.8)
TIBC: 412 ug/dL (ref 250–450)
UIBC: 282 ug/dL

## 2020-06-18 LAB — FERRITIN: Ferritin: 29 ng/mL (ref 11–307)

## 2020-06-18 NOTE — Telephone Encounter (Signed)
-----   Message from Lequita Asal, MD sent at 06/17/2020  5:16 PM EST ----- Regarding: Please call patient and FAX labs to PCP  She has chronic hyponatremia.  Sodium is lower than baseline (124 today).  M ----- Message ----- From: Buel Ream, [facility] [place] Sent: 06/17/2020   2:35 PM EST To: Lequita Asal, MD

## 2020-06-18 NOTE — Telephone Encounter (Signed)
Patient aware. Labs sent to PCP

## 2020-12-03 ENCOUNTER — Ambulatory Visit: Payer: Medicare PPO

## 2020-12-03 ENCOUNTER — Other Ambulatory Visit: Payer: Medicare PPO

## 2020-12-03 ENCOUNTER — Ambulatory Visit: Payer: Medicare PPO | Admitting: Hematology and Oncology

## 2020-12-16 ENCOUNTER — Other Ambulatory Visit: Payer: Self-pay

## 2020-12-16 DIAGNOSIS — C50912 Malignant neoplasm of unspecified site of left female breast: Secondary | ICD-10-CM

## 2020-12-17 ENCOUNTER — Inpatient Hospital Stay: Payer: Medicare PPO

## 2020-12-17 ENCOUNTER — Inpatient Hospital Stay: Payer: Medicare PPO | Attending: Oncology

## 2020-12-17 ENCOUNTER — Inpatient Hospital Stay (HOSPITAL_BASED_OUTPATIENT_CLINIC_OR_DEPARTMENT_OTHER): Payer: Medicare PPO | Admitting: Oncology

## 2020-12-17 ENCOUNTER — Other Ambulatory Visit: Payer: Self-pay

## 2020-12-17 VITALS — BP 124/75 | HR 67 | Resp 18 | Wt 158.7 lb

## 2020-12-17 DIAGNOSIS — D500 Iron deficiency anemia secondary to blood loss (chronic): Secondary | ICD-10-CM | POA: Diagnosis not present

## 2020-12-17 DIAGNOSIS — Z79899 Other long term (current) drug therapy: Secondary | ICD-10-CM | POA: Diagnosis not present

## 2020-12-17 DIAGNOSIS — Z9884 Bariatric surgery status: Secondary | ICD-10-CM | POA: Insufficient documentation

## 2020-12-17 DIAGNOSIS — C50912 Malignant neoplasm of unspecified site of left female breast: Secondary | ICD-10-CM

## 2020-12-17 DIAGNOSIS — I509 Heart failure, unspecified: Secondary | ICD-10-CM | POA: Diagnosis not present

## 2020-12-17 DIAGNOSIS — M85851 Other specified disorders of bone density and structure, right thigh: Secondary | ICD-10-CM | POA: Insufficient documentation

## 2020-12-17 DIAGNOSIS — E871 Hypo-osmolality and hyponatremia: Secondary | ICD-10-CM | POA: Diagnosis not present

## 2020-12-17 DIAGNOSIS — Z170 Estrogen receptor positive status [ER+]: Secondary | ICD-10-CM

## 2020-12-17 DIAGNOSIS — I10 Essential (primary) hypertension: Secondary | ICD-10-CM | POA: Diagnosis not present

## 2020-12-17 DIAGNOSIS — E119 Type 2 diabetes mellitus without complications: Secondary | ICD-10-CM | POA: Diagnosis not present

## 2020-12-17 DIAGNOSIS — E785 Hyperlipidemia, unspecified: Secondary | ICD-10-CM | POA: Diagnosis not present

## 2020-12-17 DIAGNOSIS — Z7984 Long term (current) use of oral hypoglycemic drugs: Secondary | ICD-10-CM | POA: Insufficient documentation

## 2020-12-17 DIAGNOSIS — K219 Gastro-esophageal reflux disease without esophagitis: Secondary | ICD-10-CM | POA: Insufficient documentation

## 2020-12-17 LAB — COMPREHENSIVE METABOLIC PANEL
ALT: 21 U/L (ref 0–44)
AST: 34 U/L (ref 15–41)
Albumin: 4 g/dL (ref 3.5–5.0)
Alkaline Phosphatase: 32 U/L — ABNORMAL LOW (ref 38–126)
Anion gap: 6 (ref 5–15)
BUN: 17 mg/dL (ref 8–23)
CO2: 28 mmol/L (ref 22–32)
Calcium: 8.9 mg/dL (ref 8.9–10.3)
Chloride: 94 mmol/L — ABNORMAL LOW (ref 98–111)
Creatinine, Ser: 0.98 mg/dL (ref 0.44–1.00)
GFR, Estimated: >60 mL/min (ref >60)
Glucose, Bld: 96 mg/dL (ref 70–99)
Potassium: 3.9 mmol/L (ref 3.5–5.1)
Sodium: 128 mmol/L — ABNORMAL LOW (ref 135–145)
Total Bilirubin: 0.4 mg/dL (ref 0.3–1.2)
Total Protein: 6.7 g/dL (ref 6.5–8.1)

## 2020-12-17 LAB — CBC WITH DIFFERENTIAL/PLATELET
Abs Immature Granulocytes: 0.04 10*3/uL (ref 0.00–0.07)
Basophils Absolute: 0.1 10*3/uL (ref 0.0–0.1)
Basophils Relative: 1 %
Eosinophils Absolute: 0.2 10*3/uL (ref 0.0–0.5)
Eosinophils Relative: 3 %
HCT: 37.4 % (ref 36.0–46.0)
Hemoglobin: 12.3 g/dL (ref 12.0–15.0)
Immature Granulocytes: 1 %
Lymphocytes Relative: 24 %
Lymphs Abs: 1.4 10*3/uL (ref 0.7–4.0)
MCH: 29.4 pg (ref 26.0–34.0)
MCHC: 32.9 g/dL (ref 30.0–36.0)
MCV: 89.3 fL (ref 80.0–100.0)
Monocytes Absolute: 0.7 10*3/uL (ref 0.1–1.0)
Monocytes Relative: 13 %
Neutro Abs: 3.2 10*3/uL (ref 1.7–7.7)
Neutrophils Relative %: 58 %
Platelets: 282 10*3/uL (ref 150–400)
RBC: 4.19 MIL/uL (ref 3.87–5.11)
RDW: 14.5 % (ref 11.5–15.5)
WBC: 5.6 10*3/uL (ref 4.0–10.5)
nRBC: 0 % (ref 0.0–0.2)

## 2020-12-17 MED ORDER — DENOSUMAB 60 MG/ML ~~LOC~~ SOSY
60.0000 mg | PREFILLED_SYRINGE | Freq: Once | SUBCUTANEOUS | Status: AC
  Administered 2020-12-17: 60 mg via SUBCUTANEOUS

## 2020-12-17 NOTE — Progress Notes (Signed)
[facility]  [address] [place], [place] [address] Phone: [phone]  Fax: [phone]   Clinic Day:  12/17/2020  Referring physician: Dereck Ligas, DO  Chief Complaint: Amanda Downs is a 70 y.o. female with stage IC left breast cancer who is seen for 6 month assessment.  HPI: The patient was last seen in the medical oncology clinic on 05/29/2021.  She received Prolia on 06/17/2020.   She last received IV Venofer on 06/25/2019.  [facility] which was negative.  Most recent bone density from 03/17/2020 revealing osteopenia with T score of -2.1.  During the interim, she reports doing well.  Energy levels are stable.  Previously had several styes (4) and was evaluated by dermatology who has diagnosed her with rosacea.  She was prescribed doxycycline which she took for 1 month but has additional prescriptions to use for as needed basis. Denies any recurrence.   Past Medical History:  Diagnosis Date  . Bariatric surgery status   . Cancer (HCC)    BREAST  . CHF (congestive heart failure) ([place])   . Diabetes mellitus without complication ([place])   . GERD (gastroesophageal reflux disease)   . Hyperlipemia   . Hypertension   . Mitral valve disorder   . Postsurgical malabsorption     Past Surgical History:  Procedure Laterality Date  . ABDOMINAL HYSTERECTOMY    . BREAST SURGERY Left   . COLONOSCOPY WITH PROPOFOL N/A 03/17/2016   Procedure: COLONOSCOPY WITH PROPOFOL;  Surgeon: Manya Silvas, MD;  Location: [facility] ENDOSCOPY;  Service: Endoscopy;  Laterality: N/A;  . HERNIA REPAIR    . ROUX-EN-Y GASTRIC BYPASS    . TONSILLECTOMY    . TUBAL LIGATION      Family History  Problem Relation Age of Onset  . Colon cancer Mother   . Stomach cancer Mother     Social History:  reports that she is a non-smoker but has been exposed to tobacco smoke. She has never used smokeless tobacco. She reports that she  does not drink alcohol and does not use drugs.  She is back to work as a Programmer, multimedia.  She lives in [place]. The patient is alone today.  Allergies:  Allergies  Allergen Reactions  . Lipitor [Atorvastatin] Other (See Comments)  . Rosuvastatin     Other reaction(s): Muscle Pain  . Simvastatin Other (See Comments)    Current Medications: Current Outpatient Medications  Medication Sig Dispense Refill  . carvedilol (COREG) 6.25 MG tablet Take 6.25 mg by mouth 2 (two) times daily.    . cyanocobalamin 2000 MCG tablet Take by mouth as needed.     . desonide (DESOWEN) 0.05 % cream APPLY TO SCALY AREA RIGHT OF EAR TWO TIMES DAILY FOR 7 DAYS MAX    . doxycycline (PERIOSTAT) 20 MG tablet Take 20 mg by mouth 2 (two) times daily.    . furosemide (LASIX) 40 MG tablet Take 40 mg by mouth daily.     Marland Kitchen LINZESS 145 MCG CAPS capsule     . magnesium oxide (MAG-OX) 400 MG tablet Take 400 mg by mouth daily.     . Omega-3 Fatty Acids (FISH OIL) 1000 MG CAPS Take 1 capsule by mouth as needed.     Marland Kitchen omeprazole (PRILOSEC) 20 MG capsule TAKE 1 CAPSULE (20 MG TOTAL) BY MOUTH ONCE DAILY AS NEEDED    . ramipril (ALTACE) 5 MG capsule Take 5 mg by mouth 2 (  two) times daily.     Marland Kitchen saccharomyces boulardii (FLORASTOR) 250 MG capsule Take 250 mg by mouth daily.     Marland Kitchen spironolactone (ALDACTONE) 25 MG tablet Take 25 mg by mouth daily.     . Vitamin D, Cholecalciferol, 400 UNITS CAPS Take 1 capsule by mouth daily.     Marland Kitchen denosumab (PROLIA) 60 MG/ML SOSY injection Inject into the skin.    Marland Kitchen DOXYCYCLINE PO Take 20 mg by mouth in the morning and at bedtime. (Patient not taking: Reported on 12/17/2020)    . ezetimibe (ZETIA) 10 MG tablet Take 10 mg by mouth daily.     Marland Kitchen levocetirizine (XYZAL) 5 MG tablet Take 5 mg by mouth every evening.     . Melatonin 3 MG TABS Take 1 tablet by mouth at bedtime.  (Patient not taking: Reported on 12/17/2020)    . metFORMIN (GLUCOPHAGE) 500 MG tablet Take 500 mg by mouth 2 (two) times daily  with a meal.     . triamcinolone (NASACORT) 55 MCG/ACT AERO nasal inhaler Place 1 spray into the nose daily.      No current facility-administered medications for this visit.   Review of Systems  Constitutional: Negative.  Negative for chills, fever, malaise/fatigue and weight loss.  HENT: Negative for congestion, ear pain and tinnitus.   Eyes: Negative.  Negative for blurred vision and double vision.  Respiratory: Negative.  Negative for cough, sputum production and shortness of breath.   Cardiovascular: Negative.  Negative for chest pain, palpitations and leg swelling.  Gastrointestinal: Negative.  Negative for abdominal pain, constipation, diarrhea, nausea and vomiting.  Genitourinary: Negative for dysuria, frequency and urgency.  Musculoskeletal: Negative for back pain and falls.  Skin: Negative.  Negative for rash.  Neurological: Negative.  Negative for weakness and headaches.  Endo/Heme/Allergies: Negative.  Does not bruise/bleed easily.  Psychiatric/Behavioral: Negative.  Negative for depression. The patient is not nervous/anxious and does not have insomnia.    Performance status (ECOG): 1  Vitals Blood pressure 124/75, pulse 67, resp. rate 18, weight 158 lb 11.7 oz (72 kg), SpO2 100 %.  Physical Exam Constitutional:      Appearance: She is well-developed.  HENT:     Head: Normocephalic and atraumatic.  Eyes:     Pupils: Pupils are equal, round, and reactive to light.  Cardiovascular:     Rate and Rhythm: Normal rate and regular rhythm.     Heart sounds: No murmur heard.   Pulmonary:     Effort: Pulmonary effort is normal.     Breath sounds: Normal breath sounds. No wheezing.  Abdominal:     General: Bowel sounds are normal. There is no distension.     Palpations: Abdomen is soft. There is no mass.     Tenderness: There is no abdominal tenderness.  Musculoskeletal:        General: Normal range of motion.     Cervical back: Normal range of motion.  Skin:     General: Skin is warm and dry.  Neurological:     Mental Status: She is alert and oriented to person, place, and time.  Psychiatric:        Behavior: Behavior normal.    Appointment on 12/17/2020  Component Date Value Ref Range Status  . Sodium 12/17/2020 128* 135 - 145 mmol/L Final  . Potassium 12/17/2020 3.9  3.5 - 5.1 mmol/L Final  . Chloride 12/17/2020 94* 98 - 111 mmol/L Final  . CO2 12/17/2020 28  22 - 32  mmol/L Final  . Glucose, Bld 12/17/2020 96  70 - 99 mg/dL Final   Glucose reference range applies only to samples taken after fasting for at least 8 hours.  . BUN 12/17/2020 17  8 - 23 mg/dL Final  . Creatinine, Ser 12/17/2020 0.98  0.44 - 1.00 mg/dL Final  . Calcium 12/17/2020 8.9  8.9 - 10.3 mg/dL Final  . Total Protein 12/17/2020 6.7  6.5 - 8.1 g/dL Final  . Albumin 12/17/2020 4.0  3.5 - 5.0 g/dL Final  . AST 12/17/2020 34  15 - 41 U/L Final  . ALT 12/17/2020 21  0 - 44 U/L Final  . Alkaline Phosphatase 12/17/2020 32* 38 - 126 U/L Final  . Total Bilirubin 12/17/2020 0.4  0.3 - 1.2 mg/dL Final  . GFR, Estimated 12/17/2020 >60  >60 mL/min Final   Comment: (NOTE) Calculated using the CKD-EPI Creatinine Equation (2021)   . Anion gap 12/17/2020 6  5 - 15 Final   Performed at [facility], [address] [place], [place] [address]  . WBC 12/17/2020 5.6  4.0 - 10.5 K/uL Final  . RBC 12/17/2020 4.19  3.87 - 5.11 MIL/uL Final  . Hemoglobin 12/17/2020 12.3  12.0 - 15.0 g/dL Final  . HCT 12/17/2020 37.4  36.0 - 46.0 % Final  . MCV 12/17/2020 89.3  80.0 - 100.0 fL Final  . MCH 12/17/2020 29.4  26.0 - 34.0 pg Final  . MCHC 12/17/2020 32.9  30.0 - 36.0 g/dL Final  . RDW 12/17/2020 14.5  11.5 - 15.5 % Final  . Platelets 12/17/2020 282  150 - 400 K/uL Final  . nRBC 12/17/2020 0.0  0.0 - 0.2 % Final  . Neutrophils Relative % 12/17/2020 58  % Final  . Neutro Abs 12/17/2020 3.2  1.7 - 7.7 K/uL Final  . Lymphocytes Relative 12/17/2020 24  % Final  . Lymphs Abs  12/17/2020 1.4  0.7 - 4.0 K/uL Final  . Monocytes Relative 12/17/2020 13  % Final  . Monocytes Absolute 12/17/2020 0.7  0.1 - 1.0 K/uL Final  . Eosinophils Relative 12/17/2020 3  % Final  . Eosinophils Absolute 12/17/2020 0.2  0.0 - 0.5 K/uL Final  . Basophils Relative 12/17/2020 1  % Final  . Basophils Absolute 12/17/2020 0.1  0.0 - 0.1 K/uL Final  . Immature Granulocytes 12/17/2020 1  % Final  . Abs Immature Granulocytes 12/17/2020 0.04  0.00 - 0.07 K/uL Final   Performed at [facility], [address] [place], [place] [address]    Assessment:  Amanda Downs is a 70 y.o. female with stage IC left breast cancers/p left partial mastectomy on 06/08/2010. Pathology revealed a 1.1 cm grade I invasive carcinoma. 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 The more superficial smaller carcinoma extended to the inked margin at the caudal aspect in an area of 3 mm in linear extent. One sentinel lymph node was negative. DCIS was present. She underwent re-excision for a positive margin. Tumor was ER+, PR+, Her2/neu -. Pathologic stage was T1cN0M0.  She received radiationfrom 07/06/2010 - 08/19/2010. She was on Arimidexfrom 09/17/2010 - 03/19/2016.  Bilateral mammogramon 12/16/2015 revealed no evidence of malignancy. Bilateral mammogram at [facility] 01/26/2017 revealed no evidence of malignancy. [facility] on 07/11/2019revealed no evidence of malignancy. Diagnostic mammogram on 02/05/2019 at [facility] revealed no evidence of malignancy.  Diagnostic mammogram at [facility] on 01/30/2020 revealed no evidence of malignancy.  CA27.29has  been followed: 24.1 on 02/18/2011, 20.9 on 09/09/2011, 24.7 on 05/11/2012, 21.7 on 06/19/2013, 16.4 on 02/20/2016, 16.1 on 03/28/2017, 13.4 on 03/30/2018, 27.9 on 11/28/2018, 19.6 on 05/29/2019, 25.4 on 11/27/2019, and 13.1 on 05/28/2020.  BCI testing on 03/02/2016 revealed a  3.6% risk of late recurrence between years 5 through 10. The likelihood of benefit of extended endocrine therapy is low.  Bone density on 03/15/2016 revealed osteoporosiswith a T score of -2.7 in the left hip. Bone density on 03/14/2018 revealed osteopeniawith a T-score of -2.0 in the right femur. Bone density on 03/17/2020 revealed osteopenia with a T score of -2.1 at the total right femur.  She received Proliaevery 6 months (03/30/2016 - 05/29/2019). She received Zometa on 11/27/2019.  She has iron deficiency anemia s/p gastric bypass surgery (07/11/2014).  Ferritin was 6 with an iron saturation of 9% and a TIBC of 485 on 09/26/2018.  Diet is good.  She denies any bleeding.   She received Venofer x 4 (06/05/2019 - 06/25/2019).   Ferritin has been followed: 7 on 05/29/2019, 46 on 08/28/2019, 52 on 11/27/2019, and 40 on 05/28/2020.  Colonoscopy on 03/17/2016 revealed 2 diminutive polyps in the proximal ascending colon and in the cecum (tubular adenomas).  She has chronic hyponatremia. She is on spironolactone and Lasix.  Symptomatically, she feels "good". No new acute concerns.   Plan: Stage IC left breast cancer Clinically, she is doing well. Exam remains stable without evidence of recurrent disease. Diagnostic mammogram at [facility] on 01/30/2020 revealed no evidence of malignancy.   CA 27-29 is normal (17.1). Continue surveillance. Repeat mammogram next month.  Orders placed.  Osteopenia Bone density on 03/17/2020 confirm persistent osteopenia. She is on calcium and vitamin D. Unable to tolerate Zometa which is what her insurance company coverage. She experienced headaches and fatigue post infusion. We were able to get Prolia approved and she received Prolia on 06/17/2020. Proceed with Prolia today.  Calcium level stable. Repeat bone density scan in August 2023. Continue Prolia every 6 months.  Iron deficiency anemia s/p gastric bypass surgery Patient is s/p gastric  bypass surgery on 07/11/2014. Colonoscopy on 03/17/2016 revealed 2 diminutive tubular adenomas.  She is due for a repeat colonoscopy next week. Labs from 12/17/2020 show a hemoglobin of 12.3, MCV 89.3.  Ferritin and iron is pending. She is taking a multivitamin with iron. She last received IV iron on 06/25/2019.  Hyponatremia, chronic Sodium 128 She is on furosemide and spironolactone. Monitored by her PCP.  Disposition Mammogram in July 2022. RTC in 6 months for MD assessment, lab work (CBC with differential, CMP, CA 27-29) plus Prolia.  Greater than 50% was spent in counseling and coordination of care with this patient including but not limited to discussion of the relevant topics above (See A&P) including, but not limited to diagnosis and management of acute and chronic medical conditions.   I discussed the assessment and treatment plan with the patient.  The patient was provided an opportunity to ask questions and all were answered.  The patient agreed with the plan and demonstrated an understanding of the instructions.  The patient was advised to call back if the symptoms worsen or if the condition fails to improve as anticipated.  Faythe Casa, NP 12/17/2020 11:29 AM

## 2020-12-17 NOTE — Patient Instructions (Signed)
Denosumab injection What is this medicine? DENOSUMAB (den oh sue mab) slows bone breakdown. Prolia is used to treat osteoporosis in women after menopause and in men, and in people who are taking corticosteroids for 6 months or more. Xgeva is used to treat a high calcium level due to cancer and to prevent bone fractures and other bone problems caused by multiple myeloma or cancer bone metastases. Xgeva is also used to treat giant cell tumor of the bone. This medicine may be used for other purposes; ask your health care provider or pharmacist if you have questions. COMMON BRAND NAME(S): Prolia, XGEVA What should I tell my health care provider before I take this medicine? They need to know if you have any of these conditions:  dental disease  having surgery or tooth extraction  infection  kidney disease  low levels of calcium or Vitamin D in the blood  malnutrition  on hemodialysis  skin conditions or sensitivity  thyroid or parathyroid disease  an unusual reaction to denosumab, other medicines, foods, dyes, or preservatives  pregnant or trying to get pregnant  breast-feeding How should I use this medicine? This medicine is for injection under the skin. It is given by a health care professional in a hospital or clinic setting. A special MedGuide will be given to you before each treatment. Be sure to read this information carefully each time. For Prolia, talk to your pediatrician regarding the use of this medicine in children. Special care may be needed. For Xgeva, talk to your pediatrician regarding the use of this medicine in children. While this drug may be prescribed for children as young as 13 years for selected conditions, precautions do apply. Overdosage: If you think you have taken too much of this medicine contact a poison control center or emergency room at once. NOTE: This medicine is only for you. Do not share this medicine with others. What if I miss a dose? It is  important not to miss your dose. Call your doctor or health care professional if you are unable to keep an appointment. What may interact with this medicine? Do not take this medicine with any of the following medications:  other medicines containing denosumab This medicine may also interact with the following medications:  medicines that lower your chance of fighting infection  steroid medicines like prednisone or cortisone This list may not describe all possible interactions. Give your health care provider a list of all the medicines, herbs, non-prescription drugs, or dietary supplements you use. Also tell them if you smoke, drink alcohol, or use illegal drugs. Some items may interact with your medicine. What should I watch for while using this medicine? Visit your doctor or health care professional for regular checks on your progress. Your doctor or health care professional may order blood tests and other tests to see how you are doing. Call your doctor or health care professional for advice if you get a fever, chills or sore throat, or other symptoms of a cold or flu. Do not treat yourself. This drug may decrease your body's ability to fight infection. Try to avoid being around people who are sick. You should make sure you get enough calcium and vitamin D while you are taking this medicine, unless your doctor tells you not to. Discuss the foods you eat and the vitamins you take with your health care professional. See your dentist regularly. Brush and floss your teeth as directed. Before you have any dental work done, tell your dentist you are   receiving this medicine. Do not become pregnant while taking this medicine or for 5 months after stopping it. Talk with your doctor or health care professional about your birth control options while taking this medicine. Women should inform their doctor if they wish to become pregnant or think they might be pregnant. There is a potential for serious side  effects to an unborn child. Talk to your health care professional or pharmacist for more information. What side effects may I notice from receiving this medicine? Side effects that you should report to your doctor or health care professional as soon as possible:  allergic reactions like skin rash, itching or hives, swelling of the face, lips, or tongue  bone pain  breathing problems  dizziness  jaw pain, especially after dental work  redness, blistering, peeling of the skin  signs and symptoms of infection like fever or chills; cough; sore throat; pain or trouble passing urine  signs of low calcium like fast heartbeat, muscle cramps or muscle pain; pain, tingling, numbness in the hands or feet; seizures  unusual bleeding or bruising  unusually weak or tired Side effects that usually do not require medical attention (report to your doctor or health care professional if they continue or are bothersome):  constipation  diarrhea  headache  joint pain  loss of appetite  muscle pain  runny nose  tiredness  upset stomach This list may not describe all possible side effects. Call your doctor for medical advice about side effects. You may report side effects to FDA at 1-800-FDA-1088. Where should I keep my medicine? This medicine is only given in a clinic, doctor's office, or other health care setting and will not be stored at home. NOTE: This sheet is a summary. It may not cover all possible information. If you have questions about this medicine, talk to your doctor, pharmacist, or health care provider.  2021 Elsevier/Gold Standard (2017-11-11 16:10:44)

## 2020-12-17 NOTE — Progress Notes (Signed)
Wants to know if the prolia is taken forever and needs referral for a mammogram to [facility] in [place].

## 2020-12-18 LAB — CANCER ANTIGEN 27.29: CA 27.29: 15.9 U/mL (ref 0.0–38.6)

## 2020-12-23 ENCOUNTER — Telehealth: Payer: Self-pay | Admitting: *Deleted

## 2020-12-23 ENCOUNTER — Other Ambulatory Visit: Payer: Self-pay

## 2020-12-23 DIAGNOSIS — C50912 Malignant neoplasm of unspecified site of left female breast: Secondary | ICD-10-CM

## 2020-12-23 NOTE — Telephone Encounter (Signed)
Patient called stating that we need to send a new order for a diagnostic mammogram and not a screening mammogram to [facility] for her annual mammogram

## 2020-12-24 ENCOUNTER — Encounter: Payer: Self-pay | Admitting: Hematology and Oncology

## 2020-12-24 NOTE — Telephone Encounter (Signed)
Per Belenda Cruise, order was faxed yesterday. Patient informed

## 2021-01-21 ENCOUNTER — Other Ambulatory Visit: Payer: Self-pay | Admitting: Oncology

## 2021-01-21 NOTE — Progress Notes (Signed)
Re: Mammogram  Patient needs to have a mammogram scheduled ASAP.   Correct mammogram entered.   Message sent to scheduling.   Faythe Casa, NP 01/21/2021 1:14 PM

## 2021-01-30 ENCOUNTER — Encounter: Payer: Self-pay | Admitting: Oncology

## 2021-02-03 ENCOUNTER — Telehealth: Payer: Self-pay | Admitting: Oncology

## 2021-02-03 NOTE — Telephone Encounter (Signed)
Re: mammogram  Recent mammogram was negative for recurrence.  Recommended follow-up in 12 months.   Report scanned into chart.  MyChart message sent to patient.  Faythe Casa, NP 02/03/2021 8:58 AM

## 2021-06-23 ENCOUNTER — Inpatient Hospital Stay: Payer: Medicare PPO | Admitting: Internal Medicine

## 2021-06-23 ENCOUNTER — Other Ambulatory Visit: Payer: Self-pay

## 2021-06-23 ENCOUNTER — Inpatient Hospital Stay: Payer: Medicare PPO

## 2021-06-23 ENCOUNTER — Inpatient Hospital Stay: Payer: Medicare PPO | Attending: Internal Medicine

## 2021-06-23 ENCOUNTER — Encounter: Payer: Self-pay | Admitting: Internal Medicine

## 2021-06-23 VITALS — BP 116/79 | HR 68 | Temp 98.4°F | Resp 16 | Ht 63.0 in | Wt 162.8 lb

## 2021-06-23 DIAGNOSIS — I110 Hypertensive heart disease with heart failure: Secondary | ICD-10-CM | POA: Diagnosis not present

## 2021-06-23 DIAGNOSIS — M810 Age-related osteoporosis without current pathological fracture: Secondary | ICD-10-CM | POA: Diagnosis not present

## 2021-06-23 DIAGNOSIS — K9589 Other complications of other bariatric procedure: Secondary | ICD-10-CM

## 2021-06-23 DIAGNOSIS — Z800 Family history of malignant neoplasm of digestive organs: Secondary | ICD-10-CM | POA: Diagnosis not present

## 2021-06-23 DIAGNOSIS — Z9884 Bariatric surgery status: Secondary | ICD-10-CM | POA: Diagnosis not present

## 2021-06-23 DIAGNOSIS — Z170 Estrogen receptor positive status [ER+]: Secondary | ICD-10-CM | POA: Diagnosis not present

## 2021-06-23 DIAGNOSIS — C50912 Malignant neoplasm of unspecified site of left female breast: Secondary | ICD-10-CM

## 2021-06-23 DIAGNOSIS — M85851 Other specified disorders of bone density and structure, right thigh: Secondary | ICD-10-CM

## 2021-06-23 DIAGNOSIS — D509 Iron deficiency anemia, unspecified: Secondary | ICD-10-CM | POA: Insufficient documentation

## 2021-06-23 DIAGNOSIS — K219 Gastro-esophageal reflux disease without esophagitis: Secondary | ICD-10-CM | POA: Insufficient documentation

## 2021-06-23 DIAGNOSIS — Z853 Personal history of malignant neoplasm of breast: Secondary | ICD-10-CM | POA: Diagnosis not present

## 2021-06-23 DIAGNOSIS — E785 Hyperlipidemia, unspecified: Secondary | ICD-10-CM | POA: Insufficient documentation

## 2021-06-23 DIAGNOSIS — D508 Other iron deficiency anemias: Secondary | ICD-10-CM

## 2021-06-23 DIAGNOSIS — I509 Heart failure, unspecified: Secondary | ICD-10-CM | POA: Diagnosis not present

## 2021-06-23 DIAGNOSIS — E119 Type 2 diabetes mellitus without complications: Secondary | ICD-10-CM | POA: Diagnosis not present

## 2021-06-23 DIAGNOSIS — Z79899 Other long term (current) drug therapy: Secondary | ICD-10-CM | POA: Insufficient documentation

## 2021-06-23 LAB — COMPREHENSIVE METABOLIC PANEL
ALT: 23 U/L (ref 0–44)
AST: 32 U/L (ref 15–41)
Albumin: 4.2 g/dL (ref 3.5–5.0)
Alkaline Phosphatase: 44 U/L (ref 38–126)
Anion gap: 10 (ref 5–15)
BUN: 22 mg/dL (ref 8–23)
CO2: 28 mmol/L (ref 22–32)
Calcium: 9.7 mg/dL (ref 8.9–10.3)
Chloride: 93 mmol/L — ABNORMAL LOW (ref 98–111)
Creatinine, Ser: 1.17 mg/dL — ABNORMAL HIGH (ref 0.44–1.00)
GFR, Estimated: 50 mL/min — ABNORMAL LOW (ref >60)
Glucose, Bld: 117 mg/dL — ABNORMAL HIGH (ref 70–99)
Potassium: 4.6 mmol/L (ref 3.5–5.1)
Sodium: 131 mmol/L — ABNORMAL LOW (ref 135–145)
Total Bilirubin: 0.5 mg/dL (ref 0.3–1.2)
Total Protein: 7.3 g/dL (ref 6.5–8.1)

## 2021-06-23 LAB — CBC WITH DIFFERENTIAL/PLATELET
Abs Immature Granulocytes: 0.06 10*3/uL (ref 0.00–0.07)
Basophils Absolute: 0.1 10*3/uL (ref 0.0–0.1)
Basophils Relative: 1 %
Eosinophils Absolute: 0.2 10*3/uL (ref 0.0–0.5)
Eosinophils Relative: 3 %
HCT: 39.4 % (ref 36.0–46.0)
Hemoglobin: 13.3 g/dL (ref 12.0–15.0)
Immature Granulocytes: 1 %
Lymphocytes Relative: 20 %
Lymphs Abs: 1.2 10*3/uL (ref 0.7–4.0)
MCH: 30 pg (ref 26.0–34.0)
MCHC: 33.8 g/dL (ref 30.0–36.0)
MCV: 88.7 fL (ref 80.0–100.0)
Monocytes Absolute: 0.7 10*3/uL (ref 0.1–1.0)
Monocytes Relative: 12 %
Neutro Abs: 3.9 10*3/uL (ref 1.7–7.7)
Neutrophils Relative %: 63 %
Platelets: 323 10*3/uL (ref 150–400)
RBC: 4.44 MIL/uL (ref 3.87–5.11)
RDW: 13.9 % (ref 11.5–15.5)
WBC: 6.2 10*3/uL (ref 4.0–10.5)
nRBC: 0 % (ref 0.0–0.2)

## 2021-06-23 MED ORDER — DENOSUMAB 60 MG/ML ~~LOC~~ SOSY
60.0000 mg | PREFILLED_SYRINGE | Freq: Once | SUBCUTANEOUS | Status: AC
  Administered 2021-06-23: 60 mg via SUBCUTANEOUS
  Filled 2021-06-23: qty 1

## 2021-06-23 NOTE — Assessment & Plan Note (Addendum)
#   Iron def /gastric bypass-hemoglobin today is normal.  Hold off any iron infusion at this time.  Not need infusions for quite some time.  # breast cancer ER- stage I July 2022- mammo-WNL [[facility]]; order next visit.  No evidence of malignancy.  # Osteoporosis on prolia- BMD- Aug 2021- T score -2.1; will order BMD ; proceed with Prolia.  # DISPOSITION:  # prolia today # Follow up in 6 months- MD; labs- cbc/cmp;Ca27-29; Prolia- Dr.B

## 2021-06-23 NOTE — Progress Notes (Signed)
Referral to [facility] for future mammograms

## 2021-06-23 NOTE — Progress Notes (Signed)
[place] CONSULT NOTE  Patient Care Team: Dereck Ligas, DO as PCP - General (Family Medicine)  CHIEF COMPLAINTS/PURPOSE OF CONSULTATION: Iron deficiency  Breast cancer- stage I ERpositive; NO chemo [s/p lumpectomy s/p RT- Dr.Corcoran] an[place] 5 an 1/2years;   BCI testing on 03/02/2016 revealed a 3.6% risk of late recurrence between years 55 through 71.  The likelihood of benefit of extended endocrine therapy is low.   Bone density on 03/15/2016 revealed osteoporosis with a T score of -2.7 in the left hip.  Bone density on 03/14/2018 revealed osteopenia with a T-score of -2.0 in the right femur.  Bone density on 03/17/2020 revealed osteopenia with a T score of -2.1 at the total right femur.  She received Prolia every 6 months (03/30/2016 - 05/29/2019). She received Zometa on 11/27/2019.   She has iron deficiency anemia s/p gastric bypass surgery (07/11/2014).  Ferritin was 6 with an iron saturation of 9% and a TIBC of 485 on 09/26/2018.  Diet is good.  She denies any bleeding.   HISTORY OF PRESENTING ILLNESS: Alone.  Ambulating independently. Amanda Downs 70 y.o.  female history of breast cancer stage I ER/PR positive and HER2 negative-currently on surveillance; history of gastric bypass/iron deficiency; osteopenia on Prolia is here for follow-up.  Patient denies any bone pain.  Any nausea vomiting diarrhea fever chills.  Denies any loss of appetite or loss of energy.  Review of Systems  Constitutional:  Negative for chills, diaphoresis, fever, malaise/fatigue and weight loss.  HENT:  Negative for nosebleeds and sore throat.   Eyes:  Negative for double vision.  Respiratory:  Negative for cough, hemoptysis, sputum production, shortness of breath and wheezing.   Cardiovascular:  Negative for chest pain, palpitations, orthopnea and leg swelling.  Gastrointestinal:  Negative for abdominal pain, blood in stool, constipation, diarrhea, heartburn, melena, nausea and  vomiting.  Genitourinary:  Negative for dysuria, frequency and urgency.  Musculoskeletal:  Negative for back pain and joint pain.  Skin: Negative.  Negative for itching and rash.  Neurological:  Negative for dizziness, tingling, focal weakness, weakness and headaches.  Endo/Heme/Allergies:  Does not bruise/bleed easily.  Psychiatric/Behavioral:  Negative for depression. The patient is not nervous/anxious and does not have insomnia.     MEDICAL HISTORY:  Past Medical History:  Diagnosis Date   Bariatric surgery status    Cancer ([place])    BREAST   CHF (congestive heart failure) (HCC)    Diabetes mellitus without complication (HCC)    GERD (gastroesophageal reflux disease)    Hyperlipemia    Hypertension    Mitral valve disorder    Ocular rosacea    Postsurgical malabsorption     SURGICAL HISTORY: Past Surgical History:  Procedure Laterality Date   ABDOMINAL HYSTERECTOMY     BREAST SURGERY Left    COLONOSCOPY WITH PROPOFOL N/A 03/17/2016   Procedure: COLONOSCOPY WITH PROPOFOL;  Surgeon: Manya Silvas, MD;  Location: [facility] ENDOSCOPY;  Service: Endoscopy;  Laterality: N/A;   HERNIA REPAIR     ROUX-EN-Y GASTRIC BYPASS     TONSILLECTOMY     TUBAL LIGATION      SOCIAL HISTORY: Social History   Socioeconomic History   Marital status: Married    Spouse name: Not on file   Number of children: Not on file   Years of education: Not on file   Highest education level: Not on file  Occupational History   Not on file  Tobacco Use   Smoking status: Never  Passive exposure: Yes   Smokeless tobacco: Never  Vaping Use   Vaping Use: Never used  Substance and Sexual Activity   Alcohol use: No    Alcohol/week: 0.0 standard drinks   Drug use: No   Sexual activity: Not on file  Other Topics Concern   Not on file  Social History Narrative   Not on file   Social Determinants of Health   Financial Resource Strain: Not on file  Food Insecurity: Not on file  Transportation  Needs: Not on file  Physical Activity: Not on file  Stress: Not on file  Social Connections: Not on file  Intimate Partner Violence: Not on file    FAMILY HISTORY: Family History  Problem Relation Age of Onset   Colon cancer Mother    Stomach cancer Mother     ALLERGIES:  is allergic to lipitor [atorvastatin], rosuvastatin, and simvastatin.  MEDICATIONS:  Current Outpatient Medications  Medication Sig Dispense Refill   carvedilol (COREG) 6.25 MG tablet Take 6.25 mg by mouth 2 (two) times daily.     cyanocobalamin 2000 MCG tablet Take by mouth as needed.      denosumab (PROLIA) 60 MG/ML SOSY injection Inject into the skin.     desonide (DESOWEN) 0.05 % cream APPLY TO SCALY AREA RIGHT OF EAR TWO TIMES DAILY FOR 7 DAYS MAX     doxycycline (PERIOSTAT) 20 MG tablet Take 20 mg by mouth 2 (two) times daily.     furosemide (LASIX) 40 MG tablet Take 40 mg by mouth daily.      levocetirizine (XYZAL) 5 MG tablet Take 5 mg by mouth as needed.     LINZESS 145 MCG CAPS capsule      magnesium oxide (MAG-OX) 400 MG tablet Take 400 mg by mouth daily.      Omega-3 Fatty Acids (FISH OIL) 1000 MG CAPS Take 1 capsule by mouth as needed.      omeprazole (PRILOSEC) 20 MG capsule TAKE 1 CAPSULE (20 MG TOTAL) BY MOUTH ONCE DAILY AS NEEDED     ramipril (ALTACE) 5 MG capsule Take 5 mg by mouth 2 (two) times daily.      spironolactone (ALDACTONE) 25 MG tablet Take 25 mg by mouth daily.      triamcinolone (NASACORT) 55 MCG/ACT AERO nasal inhaler Place 1 spray into the nose daily.      Vitamin D, Cholecalciferol, 400 UNITS CAPS Take 1 capsule by mouth daily.      ezetimibe (ZETIA) 10 MG tablet Take 10 mg by mouth daily.      metFORMIN (GLUCOPHAGE) 500 MG tablet Take 500 mg by mouth 2 (two) times daily with a meal.      saccharomyces boulardii (FLORASTOR) 250 MG capsule Take 250 mg by mouth daily.  (Patient not taking: Reported on 06/23/2021)     No current facility-administered medications for this visit.       Marland Kitchen  PHYSICAL EXAMINATION:  Vitals:   06/23/21 0935  BP: 116/79  Pulse: 68  Resp: 16  Temp: 98.4 F (36.9 C)  SpO2: 100%   Filed Weights   06/23/21 0935  Weight: 162 lb 12.8 oz (73.8 kg)    Physical Exam Vitals and nursing note reviewed.  Constitutional:      Comments:     HENT:     Head: Normocephalic and atraumatic.     Mouth/Throat:     Mouth: Mucous membranes are moist.     Pharynx: No oropharyngeal exudate.  Eyes:  Pupils: Pupils are equal, round, and reactive to light.  Cardiovascular:     Rate and Rhythm: Normal rate and regular rhythm.  Pulmonary:     Effort: No respiratory distress.     Breath sounds: No wheezing.     Comments: Decreased breath sounds bilaterally at bases.  No wheeze or crackles Abdominal:     General: Bowel sounds are normal. There is no distension.     Palpations: Abdomen is soft. There is no mass.     Tenderness: There is no abdominal tenderness. There is no guarding or rebound.  Musculoskeletal:        General: No tenderness. Normal range of motion.     Cervical back: Normal range of motion and neck supple.  Skin:    General: Skin is warm.  Neurological:     Mental Status: She is alert and oriented to person, place, and time.  Psychiatric:        Mood and Affect: Affect normal.        Judgment: Judgment normal.     LABORATORY DATA:  I have reviewed the data as listed Lab Results  Component Value Date   WBC 6.2 06/23/2021   HGB 13.3 06/23/2021   HCT 39.4 06/23/2021   MCV 88.7 06/23/2021   PLT 323 06/23/2021   Recent Labs    12/17/20 0857 06/23/21 0920  NA 128* 131*  K 3.9 4.6  CL 94* 93*  CO2 28 28  GLUCOSE 96 117*  BUN 17 22  CREATININE 0.98 1.17*  CALCIUM 8.9 9.7  GFRNONAA >60 50*  PROT 6.7 7.3  ALBUMIN 4.0 4.2  AST 34 32  ALT 21 23  ALKPHOS 32* 44  BILITOT 0.4 0.5    RADIOGRAPHIC STUDIES: I have personally reviewed the radiological images as listed and agreed with the findings in the  report. No results found.  Iron deficiency anemia following bariatric surgery # Iron def /gastric bypass-hemoglobin today is normal.  Hold off any iron infusion at this time.  Not need infusions for quite some time.  # breast cancer ER- stage I July 2022- mammo-WNL [[facility]]; order next visit.  No evidence of malignancy.  # Osteoporosis on prolia- BMD- Aug 2021- T score -2.1; will order BMD ; proceed with Prolia.  # DISPOSITION:  # prolia today # Follow up in 6 months- MD; labs- cbc/cmp;Ca27-29; Prolia- Dr.B  All questions were answered. The patient knows to call the clinic with any problems, questions or concerns.    Cammie Sickle, MD 06/23/2021 1:10 PM

## 2021-06-24 LAB — CANCER ANTIGEN 27.29: CA 27.29: 17.9 U/mL (ref 0.0–38.6)

## 2021-12-22 ENCOUNTER — Encounter: Payer: Self-pay | Admitting: Internal Medicine

## 2021-12-22 ENCOUNTER — Inpatient Hospital Stay: Payer: Medicare PPO | Attending: Internal Medicine

## 2021-12-22 ENCOUNTER — Other Ambulatory Visit: Payer: Self-pay

## 2021-12-22 ENCOUNTER — Inpatient Hospital Stay: Payer: Medicare PPO | Admitting: Internal Medicine

## 2021-12-22 ENCOUNTER — Inpatient Hospital Stay: Payer: Medicare PPO

## 2021-12-22 VITALS — BP 110/67 | HR 68 | Temp 98.3°F | Ht 63.0 in | Wt 158.0 lb

## 2021-12-22 DIAGNOSIS — D509 Iron deficiency anemia, unspecified: Secondary | ICD-10-CM | POA: Insufficient documentation

## 2021-12-22 DIAGNOSIS — C50912 Malignant neoplasm of unspecified site of left female breast: Secondary | ICD-10-CM

## 2021-12-22 DIAGNOSIS — Z853 Personal history of malignant neoplasm of breast: Secondary | ICD-10-CM | POA: Diagnosis not present

## 2021-12-22 DIAGNOSIS — D508 Other iron deficiency anemias: Secondary | ICD-10-CM

## 2021-12-22 DIAGNOSIS — M85851 Other specified disorders of bone density and structure, right thigh: Secondary | ICD-10-CM | POA: Diagnosis not present

## 2021-12-22 DIAGNOSIS — Z170 Estrogen receptor positive status [ER+]: Secondary | ICD-10-CM

## 2021-12-22 DIAGNOSIS — K9589 Other complications of other bariatric procedure: Secondary | ICD-10-CM

## 2021-12-22 LAB — CBC WITH DIFFERENTIAL/PLATELET
Abs Immature Granulocytes: 0.08 10*3/uL — ABNORMAL HIGH (ref 0.00–0.07)
Basophils Absolute: 0 10*3/uL (ref 0.0–0.1)
Basophils Relative: 0 %
Eosinophils Absolute: 0.2 10*3/uL (ref 0.0–0.5)
Eosinophils Relative: 2 %
HCT: 38.8 % (ref 36.0–46.0)
Hemoglobin: 12.9 g/dL (ref 12.0–15.0)
Immature Granulocytes: 1 %
Lymphocytes Relative: 13 %
Lymphs Abs: 1.3 10*3/uL (ref 0.7–4.0)
MCH: 29.5 pg (ref 26.0–34.0)
MCHC: 33.2 g/dL (ref 30.0–36.0)
MCV: 88.8 fL (ref 80.0–100.0)
Monocytes Absolute: 1.1 10*3/uL — ABNORMAL HIGH (ref 0.1–1.0)
Monocytes Relative: 12 %
Neutro Abs: 6.9 10*3/uL (ref 1.7–7.7)
Neutrophils Relative %: 72 %
Platelets: 300 10*3/uL (ref 150–400)
RBC: 4.37 MIL/uL (ref 3.87–5.11)
RDW: 14 % (ref 11.5–15.5)
WBC: 9.5 10*3/uL (ref 4.0–10.5)
nRBC: 0 % (ref 0.0–0.2)

## 2021-12-22 LAB — COMPREHENSIVE METABOLIC PANEL
ALT: 22 U/L (ref 0–44)
AST: 31 U/L (ref 15–41)
Albumin: 4.3 g/dL (ref 3.5–5.0)
Alkaline Phosphatase: 45 U/L (ref 38–126)
Anion gap: 7 (ref 5–15)
BUN: 15 mg/dL (ref 8–23)
CO2: 25 mmol/L (ref 22–32)
Calcium: 8.9 mg/dL (ref 8.9–10.3)
Chloride: 98 mmol/L (ref 98–111)
Creatinine, Ser: 1.05 mg/dL — ABNORMAL HIGH (ref 0.44–1.00)
GFR, Estimated: 57 mL/min — ABNORMAL LOW (ref >60)
Glucose, Bld: 116 mg/dL — ABNORMAL HIGH (ref 70–99)
Potassium: 4.8 mmol/L (ref 3.5–5.1)
Sodium: 130 mmol/L — ABNORMAL LOW (ref 135–145)
Total Bilirubin: 0.6 mg/dL (ref 0.3–1.2)
Total Protein: 7.4 g/dL (ref 6.5–8.1)

## 2021-12-22 LAB — IRON AND TIBC
Iron: 86 ug/dL (ref 28–170)
Saturation Ratios: 17 % (ref 10.4–31.8)
TIBC: 500 ug/dL — ABNORMAL HIGH (ref 250–450)
UIBC: 414 ug/dL

## 2021-12-22 LAB — FERRITIN: Ferritin: 12 ng/mL (ref 11–307)

## 2021-12-22 MED ORDER — DENOSUMAB 60 MG/ML ~~LOC~~ SOSY
60.0000 mg | PREFILLED_SYRINGE | Freq: Once | SUBCUTANEOUS | Status: AC
  Administered 2021-12-22: 60 mg via SUBCUTANEOUS
  Filled 2021-12-22: qty 1

## 2021-12-22 NOTE — Progress Notes (Signed)
[place] CONSULT NOTE  Patient Care Team: Dereck Ligas, DO as PCP - General (Family Medicine) Cammie Sickle, MD as Consulting Physician (Internal Medicine)  CHIEF COMPLAINTS/PURPOSE OF CONSULTATION: Iron deficiency  Breast cancer- stage I ERpositive; NO chemo [s/p lumpectomy s/p RT- Dr.Corcoran] anastraozle 5 an 1/2years;   BCI testing on 03/02/2016 revealed a 3.6% risk of late recurrence between years 43 through 89.  The likelihood of benefit of extended endocrine therapy is low.   Bone density on 03/15/2016 revealed osteoporosis with a T score of -2.7 in the left hip.  Bone density on 03/14/2018 revealed osteopenia with a T-score of -2.0 in the right femur.  Bone density on 03/17/2020 revealed osteopenia with a T score of -2.1 at the total right femur.  She received Prolia every 6 months (03/30/2016 - 05/29/2019). She received Zometa on 11/27/2019.   She has iron deficiency anemia s/p gastric bypass surgery (07/11/2014).  Ferritin was 6 with an iron saturation of 9% and a TIBC of 485 on 09/26/2018.  Diet is good.  She denies any bleeding.   HISTORY OF PRESENTING ILLNESS: Alone.  Ambulating independently.  Amanda Downs 71 y.o.  female history of breast cancer stage I ER/PR positive and HER2 negative-currently on surveillance; history of gastric bypass/iron deficiency; osteopenia on Prolia is here for follow-up.  Patient denies any bone pain.  Any nausea vomiting diarrhea fever chills.  Denies any loss of appetite or loss of energy.  Review of Systems  Constitutional:  Negative for chills, diaphoresis, fever, malaise/fatigue and weight loss.  HENT:  Negative for nosebleeds and sore throat.   Eyes:  Negative for double vision.  Respiratory:  Negative for cough, hemoptysis, sputum production, shortness of breath and wheezing.   Cardiovascular:  Negative for chest pain, palpitations, orthopnea and leg swelling.  Gastrointestinal:  Negative for abdominal pain,  blood in stool, constipation, diarrhea, heartburn, melena, nausea and vomiting.  Genitourinary:  Negative for dysuria, frequency and urgency.  Musculoskeletal:  Negative for back pain and joint pain.  Skin: Negative.  Negative for itching and rash.  Neurological:  Negative for dizziness, tingling, focal weakness, weakness and headaches.  Endo/Heme/Allergies:  Does not bruise/bleed easily.  Psychiatric/Behavioral:  Negative for depression. The patient is not nervous/anxious and does not have insomnia.     MEDICAL HISTORY:  Past Medical History:  Diagnosis Date   Bariatric surgery status    Cancer ([place])    BREAST   CHF (congestive heart failure) (HCC)    Diabetes mellitus without complication (HCC)    GERD (gastroesophageal reflux disease)    Hyperlipemia    Hypertension    Mitral valve disorder    Ocular rosacea    Postsurgical malabsorption     SURGICAL HISTORY: Past Surgical History:  Procedure Laterality Date   ABDOMINAL HYSTERECTOMY     BREAST SURGERY Left    COLONOSCOPY WITH PROPOFOL N/A 03/17/2016   Procedure: COLONOSCOPY WITH PROPOFOL;  Surgeon: Manya Silvas, MD;  Location: [facility] ENDOSCOPY;  Service: Endoscopy;  Laterality: N/A;   HERNIA REPAIR     ROUX-EN-Y GASTRIC BYPASS     TONSILLECTOMY     TUBAL LIGATION      SOCIAL HISTORY: Social History   Socioeconomic History   Marital status: Married    Spouse name: Not on file   Number of children: Not on file   Years of education: Not on file   Highest education level: Not on file  Occupational History   Not on file  Tobacco Use   Smoking status: Never    Passive exposure: Yes   Smokeless tobacco: Never  Vaping Use   Vaping Use: Never used  Substance and Sexual Activity   Alcohol use: No    Alcohol/week: 0.0 standard drinks   Drug use: No   Sexual activity: Not on file  Other Topics Concern   Not on file  Social History Narrative   Not on file   Social Determinants of Health   Financial Resource  Strain: Not on file  Food Insecurity: Not on file  Transportation Needs: Not on file  Physical Activity: Not on file  Stress: Not on file  Social Connections: Not on file  Intimate Partner Violence: Not on file    FAMILY HISTORY: Family History  Problem Relation Age of Onset   Colon cancer Mother    Stomach cancer Mother     ALLERGIES:  is allergic to lipitor [atorvastatin], rosuvastatin, and simvastatin.  MEDICATIONS:  Current Outpatient Medications  Medication Sig Dispense Refill   carvedilol (COREG) 6.25 MG tablet Take 6.25 mg by mouth 2 (two) times daily.     cyanocobalamin 2000 MCG tablet Take by mouth as needed.      denosumab (PROLIA) 60 MG/ML SOSY injection Inject into the skin.     desonide (DESOWEN) 0.05 % cream APPLY TO SCALY AREA RIGHT OF EAR TWO TIMES DAILY FOR 7 DAYS MAX     doxycycline (PERIOSTAT) 20 MG tablet Take 20 mg by mouth 2 (two) times daily.     furosemide (LASIX) 40 MG tablet Take 40 mg by mouth daily.      LINZESS 145 MCG CAPS capsule      magnesium oxide (MAG-OX) 400 MG tablet Take 400 mg by mouth daily.      omeprazole (PRILOSEC) 20 MG capsule TAKE 1 CAPSULE (20 MG TOTAL) BY MOUTH ONCE DAILY AS NEEDED     ramipril (ALTACE) 5 MG capsule Take 5 mg by mouth 2 (two) times daily.      spironolactone (ALDACTONE) 25 MG tablet Take 25 mg by mouth daily.      Vitamin D, Cholecalciferol, 400 UNITS CAPS Take 1 capsule by mouth daily.      ezetimibe (ZETIA) 10 MG tablet Take 10 mg by mouth daily.      levocetirizine (XYZAL) 5 MG tablet Take 5 mg by mouth as needed.     metFORMIN (GLUCOPHAGE) 500 MG tablet Take 500 mg by mouth daily with breakfast.     saccharomyces boulardii (FLORASTOR) 250 MG capsule Take 250 mg by mouth daily.  (Patient not taking: Reported on 06/23/2021)     triamcinolone (NASACORT) 55 MCG/ACT AERO nasal inhaler Place 1 spray into the nose daily.      No current facility-administered medications for this visit.      Marland Kitchen  PHYSICAL  EXAMINATION:  Vitals:   12/22/21 0941  BP: 110/67  Pulse: 68  Temp: 98.3 F (36.8 C)  SpO2: 100%   Filed Weights   12/22/21 0941  Weight: 158 lb (71.7 kg)   Right and left BREAST exam [in the presence of nurse]- no unusual skin changes or dominant masses felt. Surgical scars noted on left side.     Physical Exam Vitals and nursing note reviewed.  Constitutional:      Comments:     HENT:     Head: Normocephalic and atraumatic.     Mouth/Throat:     Mouth: Mucous membranes are moist.     Pharynx: No  oropharyngeal exudate.  Eyes:     Pupils: Pupils are equal, round, and reactive to light.  Cardiovascular:     Rate and Rhythm: Normal rate and regular rhythm.  Pulmonary:     Effort: No respiratory distress.     Breath sounds: No wheezing.     Comments: Decreased breath sounds bilaterally at bases.  No wheeze or crackles Abdominal:     General: Bowel sounds are normal. There is no distension.     Palpations: Abdomen is soft. There is no mass.     Tenderness: There is no abdominal tenderness. There is no guarding or rebound.  Musculoskeletal:        General: No tenderness. Normal range of motion.     Cervical back: Normal range of motion and neck supple.  Skin:    General: Skin is warm.  Neurological:     Mental Status: She is alert and oriented to person, place, and time.  Psychiatric:        Mood and Affect: Affect normal.        Judgment: Judgment normal.     LABORATORY DATA:  I have reviewed the data as listed Lab Results  Component Value Date   WBC 9.5 12/22/2021   HGB 12.9 12/22/2021   HCT 38.8 12/22/2021   MCV 88.8 12/22/2021   PLT 300 12/22/2021   Recent Labs    06/23/21 0920 12/22/21 0943  NA 131* 130*  K 4.6 4.8  CL 93* 98  CO2 28 25  GLUCOSE 117* 116*  BUN 22 15  CREATININE 1.17* 1.05*  CALCIUM 9.7 8.9  GFRNONAA 50* 57*  PROT 7.3 7.4  ALBUMIN 4.2 4.3  AST 32 31  ALT 23 22  ALKPHOS 44 45  BILITOT 0.5 0.6    RADIOGRAPHIC  STUDIES: I have personally reviewed the radiological images as listed and agreed with the findings in the report. No results found.  Iron deficiency anemia following bariatric surgery # Iron def /gastric bypass-hemoglobin today is Hb 12.9  Hold off any iron infusion at this time.  Not need infusions for quite some time. Constipation sec to PO iron.  Check iron levels today.  # breast cancer ER- stage I July 2022- mammo-WNL [[facility]]; ordered in July, 2023.  No evidence of malignancy.  # Osteoporosis on prolia- BMD- Aug 30th 2021- T score -2.1; will order BMD; proceed with Prolia. No dental extraction.  Patient on vitamin D.   # DISPOSITION:  # add iron studies/ferritin to today's labs # prolia today # Follow up in 6 months- MD; labs- cbc/cmp;Ca27-29;iron studies,ferritin;  Prolia; possible venofer- PRIOR BMD- Dr.B  All questions were answered. The patient knows to call the clinic with any problems, questions or concerns.    Cammie Sickle, MD 12/22/2021 11:45 AM

## 2021-12-22 NOTE — Assessment & Plan Note (Addendum)
#   Iron def /gastric bypass-hemoglobin today is Hb 12.9  Hold off any iron infusion at this time.  Not need infusions for quite some time. Constipation sec to PO iron.  Check iron levels today.  # breast cancer ER- stage I July 2022- mammo-WNL [[facility]]; ordered in July, 2023.  No evidence of malignancy.  # Osteoporosis on prolia- BMD- Aug 30th 2021- T score -2.1; will order BMD; proceed with Prolia. No dental extraction.  Patient on vitamin D.   # DISPOSITION:  # add iron studies/ferritin to today's labs # prolia today # Follow up in 6 months- MD; labs- cbc/cmp;Ca27-29;iron studies,ferritin;  Prolia; possible venofer- PRIOR BMD- Dr.B

## 2021-12-23 LAB — CANCER ANTIGEN 27.29: CA 27.29: 23.9 U/mL (ref 0.0–38.6)

## 2022-04-19 ENCOUNTER — Other Ambulatory Visit: Payer: Medicare PPO

## 2022-05-24 ENCOUNTER — Ambulatory Visit
Admission: RE | Admit: 2022-05-24 | Discharge: 2022-05-24 | Disposition: A | Discharge status: Home / Self Care | Payer: Medicare PPO | Source: Ambulatory Visit | Attending: Internal Medicine | Admitting: Internal Medicine

## 2022-05-24 DIAGNOSIS — Z170 Estrogen receptor positive status [ER+]: Secondary | ICD-10-CM | POA: Insufficient documentation

## 2022-05-24 DIAGNOSIS — M85851 Other specified disorders of bone density and structure, right thigh: Secondary | ICD-10-CM | POA: Diagnosis present

## 2022-05-24 DIAGNOSIS — C50912 Malignant neoplasm of unspecified site of left female breast: Secondary | ICD-10-CM | POA: Diagnosis present

## 2022-06-23 ENCOUNTER — Inpatient Hospital Stay: Payer: Medicare PPO | Attending: Internal Medicine | Admitting: Internal Medicine

## 2022-06-23 ENCOUNTER — Encounter: Payer: Self-pay | Admitting: Internal Medicine

## 2022-06-23 ENCOUNTER — Inpatient Hospital Stay: Payer: Medicare PPO

## 2022-06-23 VITALS — BP 114/79 | HR 64 | Temp 97.2°F | Resp 16 | Wt 158.5 lb

## 2022-06-23 VITALS — BP 116/56 | HR 64 | Resp 16

## 2022-06-23 DIAGNOSIS — I110 Hypertensive heart disease with heart failure: Secondary | ICD-10-CM | POA: Insufficient documentation

## 2022-06-23 DIAGNOSIS — Z853 Personal history of malignant neoplasm of breast: Secondary | ICD-10-CM | POA: Diagnosis not present

## 2022-06-23 DIAGNOSIS — M85851 Other specified disorders of bone density and structure, right thigh: Secondary | ICD-10-CM

## 2022-06-23 DIAGNOSIS — Z800 Family history of malignant neoplasm of digestive organs: Secondary | ICD-10-CM | POA: Diagnosis not present

## 2022-06-23 DIAGNOSIS — I509 Heart failure, unspecified: Secondary | ICD-10-CM | POA: Diagnosis not present

## 2022-06-23 DIAGNOSIS — E119 Type 2 diabetes mellitus without complications: Secondary | ICD-10-CM | POA: Insufficient documentation

## 2022-06-23 DIAGNOSIS — K9589 Other complications of other bariatric procedure: Secondary | ICD-10-CM

## 2022-06-23 DIAGNOSIS — D508 Other iron deficiency anemias: Secondary | ICD-10-CM

## 2022-06-23 DIAGNOSIS — Z90710 Acquired absence of both cervix and uterus: Secondary | ICD-10-CM | POA: Insufficient documentation

## 2022-06-23 DIAGNOSIS — D509 Iron deficiency anemia, unspecified: Secondary | ICD-10-CM | POA: Insufficient documentation

## 2022-06-23 LAB — COMPREHENSIVE METABOLIC PANEL
ALT: 27 U/L (ref 0–44)
AST: 37 U/L (ref 15–41)
Albumin: 4.2 g/dL (ref 3.5–5.0)
Alkaline Phosphatase: 42 U/L (ref 38–126)
Anion gap: 10 (ref 5–15)
BUN: 18 mg/dL (ref 8–23)
CO2: 24 mmol/L (ref 22–32)
Calcium: 9.4 mg/dL (ref 8.9–10.3)
Chloride: 96 mmol/L — ABNORMAL LOW (ref 98–111)
Creatinine, Ser: 1.03 mg/dL — ABNORMAL HIGH (ref 0.44–1.00)
GFR, Estimated: 58 mL/min — ABNORMAL LOW (ref >60)
Glucose, Bld: 119 mg/dL — ABNORMAL HIGH (ref 70–99)
Potassium: 4.5 mmol/L (ref 3.5–5.1)
Sodium: 130 mmol/L — ABNORMAL LOW (ref 135–145)
Total Bilirubin: 0.7 mg/dL (ref 0.3–1.2)
Total Protein: 7 g/dL (ref 6.5–8.1)

## 2022-06-23 LAB — CBC WITH DIFFERENTIAL/PLATELET
Abs Immature Granulocytes: 0.08 10*3/uL — ABNORMAL HIGH (ref 0.00–0.07)
Basophils Absolute: 0 10*3/uL (ref 0.0–0.1)
Basophils Relative: 1 %
Eosinophils Absolute: 0.1 10*3/uL (ref 0.0–0.5)
Eosinophils Relative: 2 %
HCT: 39.1 % (ref 36.0–46.0)
Hemoglobin: 13.2 g/dL (ref 12.0–15.0)
Immature Granulocytes: 1 %
Lymphocytes Relative: 20 %
Lymphs Abs: 1.4 10*3/uL (ref 0.7–4.0)
MCH: 29.5 pg (ref 26.0–34.0)
MCHC: 33.8 g/dL (ref 30.0–36.0)
MCV: 87.3 fL (ref 80.0–100.0)
Monocytes Absolute: 0.7 10*3/uL (ref 0.1–1.0)
Monocytes Relative: 9 %
Neutro Abs: 4.8 10*3/uL (ref 1.7–7.7)
Neutrophils Relative %: 67 %
Platelets: 308 10*3/uL (ref 150–400)
RBC: 4.48 MIL/uL (ref 3.87–5.11)
RDW: 14.6 % (ref 11.5–15.5)
WBC: 7 10*3/uL (ref 4.0–10.5)
nRBC: 0 % (ref 0.0–0.2)

## 2022-06-23 LAB — IRON AND TIBC
Iron: 105 ug/dL (ref 28–170)
Saturation Ratios: 21 % (ref 10.4–31.8)
TIBC: 507 ug/dL — ABNORMAL HIGH (ref 250–450)
UIBC: 402 ug/dL

## 2022-06-23 LAB — FERRITIN: Ferritin: 10 ng/mL — ABNORMAL LOW (ref 11–307)

## 2022-06-23 MED ORDER — DENOSUMAB 60 MG/ML ~~LOC~~ SOSY
60.0000 mg | PREFILLED_SYRINGE | Freq: Once | SUBCUTANEOUS | Status: AC
  Administered 2022-06-23: 60 mg via SUBCUTANEOUS
  Filled 2022-06-23: qty 1

## 2022-06-23 MED ORDER — SODIUM CHLORIDE 0.9 % IV SOLN
Freq: Once | INTRAVENOUS | Status: AC
  Filled 2022-06-23: qty 250

## 2022-06-23 MED ORDER — SODIUM CHLORIDE 0.9 % IV SOLN
200.0000 mg | Freq: Once | INTRAVENOUS | Status: AC
  Administered 2022-06-23: 200 mg via INTRAVENOUS
  Filled 2022-06-23: qty 10

## 2022-06-23 NOTE — Patient Instructions (Signed)

## 2022-06-23 NOTE — Assessment & Plan Note (Addendum)
#   Iron def /gastric bypass [2015]-hemoglobin today is Hb 12.9   Constipation sec to PO iron.  Check iron levels today. Proceed with venofer today.   # breast cancer ER- stage I July 2023- mammo-WNL [[facility]]; No evidence of malignancy.  # vaginal atrophy: [Dr.Cindy [place]ds[place]; [facility]]- on vaginal estrogen- reasonable; no major concerns from breast cancer standpoint.  # Osteoporosis on prolia- BMD- NOV 2023- T score -2.1 [STABLE from 2020]; proceed with Prolia x6 years. Last dose today [NOv 2023]. No dental extraction.  Patient on vitamin D.   *[facility]amm[facility] # DISPOSITION:  # venofer today # prolia today # Follow up in 6 months- MD; labs- cbc/cmp;Ca27-29;iron studies,ferritin;B levels- possible venofer-  Dr.B

## 2022-06-23 NOTE — Progress Notes (Signed)
Amanda Downs CONSULT NOTE  Patient Care Team: Dereck Ligas, DO as PCP - General (Family Medicine) Cammie Sickle, MD as Consulting Physician (Internal Medicine)  CHIEF COMPLAINTS/PURPOSE OF CONSULTATION: Iron deficiency  Breast cancer- stage I ERpositive; NO chemo [s/p lumpectomy s/p RT- Dr.Corcoran] anastraozle 5 an 1/2years;   BCI testing on 03/02/2016 revealed a 3.6% risk of late recurrence between years 34 through 36.  The likelihood of benefit of extended endocrine therapy is low.   Bone density on 03/15/2016 revealed osteoporosis with a T score of -2.7 in the left hip.  Bone density on 03/14/2018 revealed osteopenia with a T-score of -2.0 in the right femur.  Bone density on 03/17/2020 revealed osteopenia with a T score of -2.1 at the total right femur.  She received Prolia every 6 months (03/30/2016 - 05/29/2019). She received Zometa on 11/27/2019.   She has iron deficiency anemia s/p gastric bypass surgery (07/11/2014).  Ferritin was 6 with an iron saturation of 9% and a TIBC of 485 on 09/26/2018.  Diet is good.  She denies any bleeding.   HISTORY OF PRESENTING ILLNESS: Alone.  Ambulating independently.  Amanda Downs 71 y.o.  female history of breast cancer stage I ER/PR positive and HER2 negative-currently on surveillance; history of gastric bypass/iron deficiency; osteopenia on Prolia is here for follow-up.  Started receiving Prolia injection in 03/2016.  Would like to discuss if she is to continue Prolia.  Started estradiol vaginal cream as directed by GYN  Patient denies any bone pain.  Any nausea vomiting diarrhea fever chills.  Denies any loss of appetite or loss of energy.  Review of Systems  Constitutional:  Negative for chills, diaphoresis, fever, malaise/fatigue and weight loss.  HENT:  Negative for nosebleeds and sore throat.   Eyes:  Negative for double vision.  Respiratory:  Negative for cough, hemoptysis, sputum production, shortness of breath  and wheezing.   Cardiovascular:  Negative for chest pain, palpitations, orthopnea and leg swelling.  Gastrointestinal:  Negative for abdominal pain, blood in stool, constipation, diarrhea, heartburn, melena, nausea and vomiting.  Genitourinary:  Negative for dysuria, frequency and urgency.  Musculoskeletal:  Negative for back pain and joint pain.  Skin: Negative.  Negative for itching and rash.  Neurological:  Negative for dizziness, tingling, focal weakness, weakness and headaches.  Endo/Heme/Allergies:  Does not bruise/bleed easily.  Psychiatric/Behavioral:  Negative for depression. The patient is not nervous/anxious and does not have insomnia.      MEDICAL HISTORY:  Past Medical History:  Diagnosis Date   Bariatric surgery status    Cancer ([place])    BREAST   CHF (congestive heart failure) (HCC)    Diabetes mellitus without complication (HCC)    GERD (gastroesophageal reflux disease)    Hyperlipemia    Hypertension    Mitral valve disorder    Ocular rosacea    Postsurgical malabsorption     SURGICAL HISTORY: Past Surgical History:  Procedure Laterality Date   ABDOMINAL HYSTERECTOMY     BREAST SURGERY Left    COLONOSCOPY WITH PROPOFOL N/A 03/17/2016   Procedure: COLONOSCOPY WITH PROPOFOL;  Surgeon: Manya Silvas, MD;  Location: [facility];  Service: Endoscopy;  Laterality: N/A;   HERNIA REPAIR     ROUX-EN-Y GASTRIC BYPASS     TONSILLECTOMY     TUBAL LIGATION      SOCIAL HISTORY: Social History   Socioeconomic History   Marital status: Married    Spouse name: Not on file   Number of  children: Not on file   Years of education: Not on file   Highest education level: Not on file  Occupational History   Not on file  Tobacco Use   Smoking status: Never    Passive exposure: Yes   Smokeless tobacco: Never  Vaping Use   Vaping Use: Never used  Substance and Sexual Activity   Alcohol use: No    Alcohol/week: 0.0 standard drinks of alcohol   Drug use: No    Sexual activity: Not on file  Other Topics Concern   Not on file  Social History Narrative   Not on file   Social Determinants of Health   Financial Resource Strain: Not on file  Food Insecurity: Not on file  Transportation Needs: Not on file  Physical Activity: Not on file  Stress: Not on file  Social Connections: Not on file  Intimate Partner Violence: Not on file    FAMILY HISTORY: Family History  Problem Relation Age of Onset   Colon cancer Mother    Stomach cancer Mother     ALLERGIES:  is allergic to lipitor [atorvastatin], rosuvastatin, and simvastatin.  MEDICATIONS:  Current Outpatient Medications  Medication Sig Dispense Refill   carvedilol (COREG) 6.25 MG tablet Take 6.25 mg by mouth 2 (two) times daily.     cyanocobalamin 2000 MCG tablet Take by mouth as needed.      denosumab (PROLIA) 60 MG/ML SOSY injection Inject into the skin.     desonide (DESOWEN) 0.05 % cream APPLY TO SCALY AREA RIGHT OF EAR TWO TIMES DAILY FOR 7 DAYS MAX     doxycycline (PERIOSTAT) 20 MG tablet Take 20 mg by mouth 2 (two) times daily.     estradiol (ESTRACE) 0.1 MG/GM vaginal cream Place vaginally.     ezetimibe (ZETIA) 10 MG tablet Take 10 mg by mouth daily.      furosemide (LASIX) 40 MG tablet Take 40 mg by mouth daily.      LINZESS 145 MCG CAPS capsule      magnesium oxide (MAG-OX) 400 MG tablet Take 400 mg by mouth daily.      metFORMIN (GLUCOPHAGE) 500 MG tablet Take 500 mg by mouth daily with breakfast.     omeprazole (PRILOSEC) 20 MG capsule TAKE 1 CAPSULE (20 MG TOTAL) BY MOUTH ONCE DAILY AS NEEDED     ramipril (ALTACE) 5 MG capsule Take 5 mg by mouth 2 (two) times daily.      spironolactone (ALDACTONE) 25 MG tablet Take 25 mg by mouth daily.      triamcinolone (NASACORT) 55 MCG/ACT AERO nasal inhaler Place 1 spray into the nose daily.      Vitamin D, Cholecalciferol, 400 UNITS CAPS Take 1 capsule by mouth daily.      levocetirizine (XYZAL) 5 MG tablet Take 5 mg by mouth as  needed. (Patient not taking: Reported on 06/23/2022)     saccharomyces boulardii (FLORASTOR) 250 MG capsule Take 250 mg by mouth daily.  (Patient not taking: Reported on 06/23/2021)     No current facility-administered medications for this visit.   Facility-Administered Medications Ordered in Other Visits  Medication Dose Route Frequency Provider Last Rate Last Admin   denosumab (PROLIA) injection 60 mg  60 mg Subcutaneous Once Charlaine Dalton R, MD          .  PHYSICAL EXAMINATION:  Vitals:   06/23/22 1300  BP: 114/79  Pulse: 64  Resp: 16  Temp: (!) 97.2 F (36.2 C)   Filed Weights  06/23/22 1300  Weight: 158 lb 8 oz (71.9 kg)   Right and left BREAST exam [in the presence of nurse]- no unusual skin changes or dominant masses felt. Surgical scars noted on left side.     Physical Exam Vitals and nursing note reviewed.  Constitutional:      Comments:     HENT:     Head: Normocephalic and atraumatic.     Mouth/Throat:     Mouth: Mucous membranes are moist.     Pharynx: No oropharyngeal exudate.  Eyes:     Pupils: Pupils are equal, round, and reactive to light.  Cardiovascular:     Rate and Rhythm: Normal rate and regular rhythm.  Pulmonary:     Effort: No respiratory distress.     Breath sounds: No wheezing.     Comments: Decreased breath sounds bilaterally at bases.  No wheeze or crackles Abdominal:     General: Bowel sounds are normal. There is no distension.     Palpations: Abdomen is soft. There is no mass.     Tenderness: There is no abdominal tenderness. There is no guarding or rebound.  Musculoskeletal:        General: No tenderness. Normal range of motion.     Cervical back: Normal range of motion and neck supple.  Skin:    General: Skin is warm.  Neurological:     Mental Status: She is alert and oriented to person, place, and time.  Psychiatric:        Mood and Affect: Affect normal.        Judgment: Judgment normal.      LABORATORY DATA:   I have reviewed the data as listed Lab Results  Component Value Date   WBC 7.0 06/23/2022   HGB 13.2 06/23/2022   HCT 39.1 06/23/2022   MCV 87.3 06/23/2022   PLT 308 06/23/2022   Recent Labs    12/22/21 0943 06/23/22 1236  NA 130* 130*  K 4.8 4.5  CL 98 96*  CO2 25 24  GLUCOSE 116* 119*  BUN 15 18  CREATININE 1.05* 1.03*  CALCIUM 8.9 9.4  GFRNONAA 57* 58*  PROT 7.4 7.0  ALBUMIN 4.3 4.2  AST 31 37  ALT 22 27  ALKPHOS 45 42  BILITOT 0.6 0.7    RADIOGRAPHIC STUDIES: I have personally reviewed the radiological images as listed and agreed with the findings in the report. No results found.  Iron deficiency anemia following bariatric surgery # Iron def /gastric bypass [2015]-hemoglobin today is Hb 12.9  Hold off any iron infusion at this time.  Not need infusions for quite some time. Constipation sec to PO iron.  Check iron levels today.  # breast cancer ER- stage I July 2022- mammo-WNL [[facility]]; ordered in July, 2023.  No evidence of malignancy.  # vaginal atrophy: [Dr.Cindy Edmundsun]- on vaginal estrogen- reasonable.   # Osteoporosis on prolia- BMD- NOV 2023- T score -2.1 [STABLE from 2020]; proceed with Prolia x6 years. Last dose today [NOv 2023]. No dental extraction.  Patient on vitamin D.   # DISPOSITION:  # venofer today # prolia today # Follow up in 6 months- MD; labs- cbc/cmp;Ca27-29;iron studies,ferritin;B levels- possible venofer-  Dr.B  All questions were answered. The patient knows to call the clinic with any problems, questions or concerns.    Cammie Sickle, MD 06/23/2022 2:33 PM

## 2022-06-23 NOTE — Progress Notes (Signed)
Started receiving Prolia injection in 03/2016.  Would like to discuss if she is to continue Prolia.    Started estradiol vaginal cream as directed by GYN.

## 2022-06-24 LAB — CANCER ANTIGEN 27.29: CA 27.29: 16.7 U/mL (ref 0.0–38.6)

## 2022-07-07 HISTORY — PX: VAGINAL PROLAPSE REPAIR: SHX830

## 2022-12-24 ENCOUNTER — Ambulatory Visit: Payer: Medicare PPO | Admitting: Internal Medicine

## 2022-12-24 ENCOUNTER — Other Ambulatory Visit: Payer: Medicare PPO

## 2022-12-24 ENCOUNTER — Ambulatory Visit: Payer: Medicare PPO

## 2023-01-11 ENCOUNTER — Inpatient Hospital Stay: Payer: Medicare PPO | Attending: Internal Medicine | Admitting: Internal Medicine

## 2023-01-11 ENCOUNTER — Encounter: Payer: Self-pay | Admitting: Internal Medicine

## 2023-01-11 ENCOUNTER — Inpatient Hospital Stay: Payer: Medicare PPO

## 2023-01-11 VITALS — BP 135/83 | HR 77 | Temp 98.1°F | Ht 63.0 in | Wt 160.8 lb

## 2023-01-11 VITALS — BP 121/60 | HR 69 | Resp 18

## 2023-01-11 DIAGNOSIS — M810 Age-related osteoporosis without current pathological fracture: Secondary | ICD-10-CM | POA: Diagnosis not present

## 2023-01-11 DIAGNOSIS — Z79899 Other long term (current) drug therapy: Secondary | ICD-10-CM | POA: Diagnosis not present

## 2023-01-11 DIAGNOSIS — D508 Other iron deficiency anemias: Secondary | ICD-10-CM

## 2023-01-11 DIAGNOSIS — N952 Postmenopausal atrophic vaginitis: Secondary | ICD-10-CM | POA: Diagnosis not present

## 2023-01-11 DIAGNOSIS — Z853 Personal history of malignant neoplasm of breast: Secondary | ICD-10-CM | POA: Insufficient documentation

## 2023-01-11 DIAGNOSIS — M85851 Other specified disorders of bone density and structure, right thigh: Secondary | ICD-10-CM

## 2023-01-11 DIAGNOSIS — D509 Iron deficiency anemia, unspecified: Secondary | ICD-10-CM | POA: Diagnosis not present

## 2023-01-11 DIAGNOSIS — Z9884 Bariatric surgery status: Secondary | ICD-10-CM | POA: Insufficient documentation

## 2023-01-11 DIAGNOSIS — K9589 Other complications of other bariatric procedure: Secondary | ICD-10-CM | POA: Diagnosis not present

## 2023-01-11 LAB — COMPREHENSIVE METABOLIC PANEL
ALT: 19 U/L (ref 0–44)
AST: 23 U/L (ref 15–41)
Albumin: 4.1 g/dL (ref 3.5–5.0)
Alkaline Phosphatase: 43 U/L (ref 38–126)
Anion gap: 11 (ref 5–15)
BUN: 19 mg/dL (ref 8–23)
CO2: 24 mmol/L (ref 22–32)
Calcium: 9.4 mg/dL (ref 8.9–10.3)
Chloride: 98 mmol/L (ref 98–111)
Creatinine, Ser: 1.11 mg/dL — ABNORMAL HIGH (ref 0.44–1.00)
GFR, Estimated: 53 mL/min — ABNORMAL LOW (ref >60)
Glucose, Bld: 105 mg/dL — ABNORMAL HIGH (ref 70–99)
Potassium: 4.6 mmol/L (ref 3.5–5.1)
Sodium: 133 mmol/L — ABNORMAL LOW (ref 135–145)
Total Bilirubin: 0.6 mg/dL (ref 0.3–1.2)
Total Protein: 6.9 g/dL (ref 6.5–8.1)

## 2023-01-11 LAB — CBC WITH DIFFERENTIAL/PLATELET
Abs Immature Granulocytes: 0.08 10*3/uL — ABNORMAL HIGH (ref 0.00–0.07)
Basophils Absolute: 0.1 10*3/uL (ref 0.0–0.1)
Basophils Relative: 1 %
Eosinophils Absolute: 0.2 10*3/uL (ref 0.0–0.5)
Eosinophils Relative: 4 %
HCT: 36.4 % (ref 36.0–46.0)
Hemoglobin: 12.1 g/dL (ref 12.0–15.0)
Immature Granulocytes: 1 %
Lymphocytes Relative: 20 %
Lymphs Abs: 1.3 10*3/uL (ref 0.7–4.0)
MCH: 29.3 pg (ref 26.0–34.0)
MCHC: 33.2 g/dL (ref 30.0–36.0)
MCV: 88.1 fL (ref 80.0–100.0)
Monocytes Absolute: 0.7 10*3/uL (ref 0.1–1.0)
Monocytes Relative: 10 %
Neutro Abs: 4.1 10*3/uL (ref 1.7–7.7)
Neutrophils Relative %: 64 %
Platelets: 300 10*3/uL (ref 150–400)
RBC: 4.13 MIL/uL (ref 3.87–5.11)
RDW: 14.6 % (ref 11.5–15.5)
WBC: 6.4 10*3/uL (ref 4.0–10.5)
nRBC: 0 % (ref 0.0–0.2)

## 2023-01-11 LAB — FERRITIN: Ferritin: 15 ng/mL (ref 11–307)

## 2023-01-11 LAB — IRON AND TIBC
Iron: 101 ug/dL (ref 28–170)
Saturation Ratios: 22 % (ref 10.4–31.8)
TIBC: 455 ug/dL — ABNORMAL HIGH (ref 250–450)
UIBC: 354 ug/dL

## 2023-01-11 LAB — VITAMIN B12: Vitamin B-12: 797 pg/mL (ref 180–914)

## 2023-01-11 MED ORDER — SODIUM CHLORIDE 0.9 % IV SOLN
Freq: Once | INTRAVENOUS | Status: AC
  Filled 2023-01-11: qty 250

## 2023-01-11 MED ORDER — SODIUM CHLORIDE 0.9 % IV SOLN
200.0000 mg | Freq: Once | INTRAVENOUS | Status: AC
  Administered 2023-01-11: 200 mg via INTRAVENOUS
  Filled 2023-01-11: qty 200

## 2023-01-11 NOTE — Progress Notes (Signed)
Amanda Downs CONSULT NOTE  Patient Care Team: Damaris Schooner, DO as PCP - General (Family Medicine) Amanda Coder, MD as Consulting Physician (Internal Medicine)  CHIEF COMPLAINTS/PURPOSE OF CONSULTATION: Iron deficiency  Breast cancer- stage I ERpositive; NO chemo [s/p lumpectomy s/p RT- Dr.Corcoran] anastraozle 5 an 1/2years;   BCI testing on 03/02/2016 revealed a 3.6% risk of late recurrence between years 5 through 10.  The likelihood of benefit of extended endocrine therapy is low.   Bone density on 03/15/2016 revealed osteoporosis with a T score of -2.7 in the left hip.  Bone density on 03/14/2018 revealed osteopenia with a T-score of -2.0 in the right femur.  Bone density on 03/17/2020 revealed osteopenia with a T score of -2.1 at the total right femur.  She received Prolia every 6 months (03/30/2016 - 05/29/2019). She received Zometa on 11/27/2019.   She has iron deficiency anemia s/p gastric bypass surgery (07/11/2014).  Ferritin was 6 with an iron saturation of 9% and a TIBC of 485 on 09/26/2018.  Diet is good.  She denies any bleeding.   HISTORY OF PRESENTING ILLNESS: Alone.  Ambulating independently.  Amanda Downs 72 y.o.  female history of breast cancer stage I ER/PR positive and HER2 negative-currently on surveillance; history of gastric bypass/iron deficiency; osteopenia on Prolia [off; last nov 2023] is here for follow-up.  Complains of mild fatigue.  Otherwise no weight loss no clear consistent episodes of nausea vomiting.   Patient denies any bone pain.  Any nausea vomiting diarrhea fever chills.  Denies any loss of appetite or loss of energy.  Review of Systems  Constitutional:  Negative for chills, diaphoresis, fever, malaise/fatigue and weight loss.  HENT:  Negative for nosebleeds and sore throat.   Eyes:  Negative for double vision.  Respiratory:  Negative for cough, hemoptysis, sputum production, shortness of breath and wheezing.    Cardiovascular:  Negative for chest pain, palpitations, orthopnea and leg swelling.  Gastrointestinal:  Negative for abdominal pain, blood in stool, constipation, diarrhea, heartburn, melena, nausea and vomiting.  Genitourinary:  Negative for dysuria, frequency and urgency.  Musculoskeletal:  Negative for back pain and joint pain.  Skin: Negative.  Negative for itching and rash.  Neurological:  Negative for dizziness, tingling, focal weakness, weakness and headaches.  Endo/Heme/Allergies:  Does not bruise/bleed easily.  Psychiatric/Behavioral:  Negative for depression. The patient is not nervous/anxious and does not have insomnia.      MEDICAL HISTORY:  Past Medical History:  Diagnosis Date   Bariatric surgery status    Cancer (HCC)    BREAST   CHF (congestive heart failure) (HCC)    Diabetes mellitus without complication (HCC)    GERD (gastroesophageal reflux disease)    Hyperlipemia    Hypertension    Mitral valve disorder    Ocular rosacea    Postsurgical malabsorption     SURGICAL HISTORY: Past Surgical History:  Procedure Laterality Date   ABDOMINAL HYSTERECTOMY     BREAST SURGERY Left    COLONOSCOPY WITH PROPOFOL N/A 03/17/2016   Procedure: COLONOSCOPY WITH PROPOFOL;  Surgeon: Scot Jun, MD;  Location: [facility] ENDOSCOPY;  Service: Endoscopy;  Laterality: N/A;   HERNIA REPAIR     ROUX-EN-Y GASTRIC BYPASS     TONSILLECTOMY     TUBAL LIGATION     VAGINAL PROLAPSE REPAIR  07/07/2022   at [facility]    SOCIAL HISTORY: Social History   Socioeconomic History   Marital status: Married    Spouse name: Not  on file   Number of children: Not on file   Years of education: Not on file   Highest education level: Not on file  Occupational History   Not on file  Tobacco Use   Smoking status: Never    Passive exposure: Yes   Smokeless tobacco: Never  Vaping Use   Vaping Use: Never used  Substance and Sexual Activity   Alcohol use: No    Alcohol/week: 0.0 standard  drinks of alcohol   Drug use: No   Sexual activity: Not on file  Other Topics Concern   Not on file  Social History Narrative   Not on file   Social Determinants of Health   Financial Resource Strain: Not on file  Food Insecurity: Not on file  Transportation Needs: Not on file  Physical Activity: Not on file  Stress: Not on file  Social Connections: Not on file  Intimate Partner Violence: Not on file    FAMILY HISTORY: Family History  Problem Relation Age of Onset   Colon cancer Mother    Stomach cancer Mother     ALLERGIES:  is allergic to lipitor [atorvastatin], rosuvastatin, and simvastatin.  MEDICATIONS:  Current Outpatient Medications  Medication Sig Dispense Refill   carvedilol (COREG) 6.25 MG tablet Take 6.25 mg by mouth 2 (two) times daily.     cyanocobalamin 2000 MCG tablet Take by mouth as needed.      denosumab (PROLIA) 60 MG/ML SOSY injection Inject into the skin.     desonide (DESOWEN) 0.05 % cream APPLY TO SCALY AREA RIGHT OF EAR TWO TIMES DAILY FOR 7 DAYS MAX     doxycycline (PERIOSTAT) 20 MG tablet Take 20 mg by mouth 2 (two) times daily.     estradiol (ESTRACE) 0.1 MG/GM vaginal cream Place vaginally.     furosemide (LASIX) 40 MG tablet Take 40 mg by mouth daily.      LINZESS 145 MCG CAPS capsule      magnesium oxide (MAG-OX) 400 MG tablet Take 400 mg by mouth daily.      omeprazole (PRILOSEC) 20 MG capsule TAKE 1 CAPSULE (20 MG TOTAL) BY MOUTH ONCE DAILY AS NEEDED     ramipril (ALTACE) 5 MG capsule Take 5 mg by mouth 2 (two) times daily.      spironolactone (ALDACTONE) 25 MG tablet Take 25 mg by mouth daily.      Vitamin D, Cholecalciferol, 400 UNITS CAPS Take 1 capsule by mouth daily.      ezetimibe (ZETIA) 10 MG tablet Take 10 mg by mouth daily.      levocetirizine (XYZAL) 5 MG tablet Take 5 mg by mouth as needed. (Patient not taking: Reported on 06/23/2022)     metFORMIN (GLUCOPHAGE) 500 MG tablet Take 500 mg by mouth daily with breakfast.      saccharomyces boulardii (FLORASTOR) 250 MG capsule Take 250 mg by mouth daily.  (Patient not taking: Reported on 06/23/2021)     triamcinolone (NASACORT) 55 MCG/ACT AERO nasal inhaler Place 1 spray into the nose daily.      No current facility-administered medications for this visit.      Marland Kitchen  PHYSICAL EXAMINATION:  Vitals:   01/11/23 1417  BP: 135/83  Pulse: 77  Temp: 98.1 F (36.7 C)  SpO2: 100%   Filed Weights   01/11/23 1417  Weight: 160 lb 12.8 oz (72.9 kg)     Physical Exam Vitals and nursing note reviewed.  Constitutional:      Comments:  HENT:     Head: Normocephalic and atraumatic.     Mouth/Throat:     Mouth: Mucous membranes are moist.     Pharynx: No oropharyngeal exudate.  Eyes:     Pupils: Pupils are equal, round, and reactive to light.  Cardiovascular:     Rate and Rhythm: Normal rate and regular rhythm.  Pulmonary:     Effort: No respiratory distress.     Breath sounds: No wheezing.     Comments: Decreased breath sounds bilaterally at bases.  No wheeze or crackles Abdominal:     General: Bowel sounds are normal. There is no distension.     Palpations: Abdomen is soft. There is no mass.     Tenderness: There is no abdominal tenderness. There is no guarding or rebound.  Musculoskeletal:        General: No tenderness. Normal range of motion.     Cervical back: Normal range of motion and neck supple.  Skin:    General: Skin is warm.  Neurological:     Mental Status: She is alert and oriented to person, place, and time.  Psychiatric:        Mood and Affect: Affect normal.        Judgment: Judgment normal.      LABORATORY DATA:  I have reviewed the data as listed Lab Results  Component Value Date   WBC 6.4 01/11/2023   HGB 12.1 01/11/2023   HCT 36.4 01/11/2023   MCV 88.1 01/11/2023   PLT 300 01/11/2023   Recent Labs    06/23/22 1236 01/11/23 1421  NA 130* 133*  K 4.5 4.6  CL 96* 98  CO2 24 24  GLUCOSE 119* 105*  BUN 18 19   CREATININE 1.03* 1.11*  CALCIUM 9.4 9.4  GFRNONAA 58* 53*  PROT 7.0 6.9  ALBUMIN 4.2 4.1  AST 37 23  ALT 27 19  ALKPHOS 42 43  BILITOT 0.7 0.6    RADIOGRAPHIC STUDIES: I have personally reviewed the radiological images as listed and agreed with the findings in the report. No results found.  Iron deficiency anemia following bariatric surgery # Iron def /gastric bypass [2015]-hemoglobin today is Hb 12.9   Constipation sec to PO iron. Penidng-  iron levels today. Proceed with venofer today. On B12 gummies.   # breast cancer ER- stage I July 2023- mammo-WNL [[facility]]; No evidence of malignancy.  # vaginal atrophy: [Dr.Cindy Edmundsun; [facility]]- on vaginal estrogen- reasonable; no major concerns from breast cancer standpoint.  # Osteoporosis on prolia- BMD- NOV 2023- T score -2.1 [STABLE from 2020]; proceed with Prolia x6 years. Last dose today [NOv 2023]. No dental extraction.  Patient on vitamin D. Will repeat BMD in 2025.   *[facility] # DISPOSITION:  # venofer today # Follow up in 6 months- MD; labs- cbc/cmp;Ca27-29;iron studies,ferritin;B12 levels- possible venofer-  Dr.B  All questions were answered. The patient knows to call the clinic with any problems, questions or concerns.    Amanda Coder, MD 01/11/2023 2:51 PM

## 2023-01-11 NOTE — Patient Instructions (Signed)

## 2023-01-11 NOTE — Progress Notes (Signed)
No concerns today 

## 2023-01-11 NOTE — Assessment & Plan Note (Addendum)
#   Iron def /gastric bypass [2015]-hemoglobin today is Hb 12.9   Constipation sec to PO iron. Penidng-  iron levels today. Proceed with venofer today. On B12 gummies.   # breast cancer ER- stage I July 2023- mammo-WNL [[facility]]; No evidence of malignancy.  # vaginal atrophy: [Dr.Cindy Edmundsun; [facility]]- on vaginal estrogen- reasonable; no major concerns from breast cancer standpoint.  # Osteoporosis on prolia- BMD- NOV 2023- T score -2.1 [STABLE from 2020]; proceed with Prolia x6 years. Last dose today [NOv 2023]. No dental extraction.  Patient on vitamin D. Will repeat BMD in 2025.   *[facility] # DISPOSITION:  # venofer today # Follow up in 6 months- MD; labs- cbc/cmp;Ca27-29;iron studies,ferritin;B12 levels- possible venofer-  Dr.B

## 2023-01-12 LAB — CANCER ANTIGEN 27.29: CA 27.29: 19.4 U/mL (ref 0.0–38.6)

## 2023-07-06 ENCOUNTER — Inpatient Hospital Stay: Payer: Medicare PPO | Attending: Internal Medicine

## 2023-07-06 ENCOUNTER — Inpatient Hospital Stay: Payer: Medicare PPO | Admitting: Internal Medicine

## 2023-07-06 ENCOUNTER — Inpatient Hospital Stay: Payer: Medicare PPO

## 2023-07-06 ENCOUNTER — Encounter: Payer: Self-pay | Admitting: Internal Medicine

## 2023-07-06 VITALS — BP 114/66 | HR 69

## 2023-07-06 VITALS — BP 112/72 | HR 78 | Temp 97.7°F | Ht 63.0 in | Wt 157.0 lb

## 2023-07-06 DIAGNOSIS — D508 Other iron deficiency anemias: Secondary | ICD-10-CM | POA: Diagnosis not present

## 2023-07-06 DIAGNOSIS — N952 Postmenopausal atrophic vaginitis: Secondary | ICD-10-CM | POA: Diagnosis not present

## 2023-07-06 DIAGNOSIS — Z853 Personal history of malignant neoplasm of breast: Secondary | ICD-10-CM | POA: Diagnosis not present

## 2023-07-06 DIAGNOSIS — K9589 Other complications of other bariatric procedure: Secondary | ICD-10-CM

## 2023-07-06 DIAGNOSIS — Z90710 Acquired absence of both cervix and uterus: Secondary | ICD-10-CM | POA: Diagnosis not present

## 2023-07-06 DIAGNOSIS — M85851 Other specified disorders of bone density and structure, right thigh: Secondary | ICD-10-CM | POA: Insufficient documentation

## 2023-07-06 DIAGNOSIS — Z800 Family history of malignant neoplasm of digestive organs: Secondary | ICD-10-CM | POA: Insufficient documentation

## 2023-07-06 DIAGNOSIS — C50912 Malignant neoplasm of unspecified site of left female breast: Secondary | ICD-10-CM

## 2023-07-06 DIAGNOSIS — Z9884 Bariatric surgery status: Secondary | ICD-10-CM | POA: Insufficient documentation

## 2023-07-06 DIAGNOSIS — Z170 Estrogen receptor positive status [ER+]: Secondary | ICD-10-CM

## 2023-07-06 LAB — CBC WITH DIFFERENTIAL (CANCER CENTER ONLY)
Abs Immature Granulocytes: 0.05 10*3/uL (ref 0.00–0.07)
Basophils Absolute: 0.1 10*3/uL (ref 0.0–0.1)
Basophils Relative: 1 %
Eosinophils Absolute: 0.2 10*3/uL (ref 0.0–0.5)
Eosinophils Relative: 3 %
HCT: 35.4 % — ABNORMAL LOW (ref 36.0–46.0)
Hemoglobin: 11.7 g/dL — ABNORMAL LOW (ref 12.0–15.0)
Immature Granulocytes: 1 %
Lymphocytes Relative: 19 %
Lymphs Abs: 1.1 10*3/uL (ref 0.7–4.0)
MCH: 30.2 pg (ref 26.0–34.0)
MCHC: 33.1 g/dL (ref 30.0–36.0)
MCV: 91.2 fL (ref 80.0–100.0)
Monocytes Absolute: 0.6 10*3/uL (ref 0.1–1.0)
Monocytes Relative: 10 %
Neutro Abs: 3.9 10*3/uL (ref 1.7–7.7)
Neutrophils Relative %: 66 %
Platelet Count: 258 10*3/uL (ref 150–400)
RBC: 3.88 MIL/uL (ref 3.87–5.11)
RDW: 13.9 % (ref 11.5–15.5)
WBC Count: 5.8 10*3/uL (ref 4.0–10.5)
nRBC: 0 % (ref 0.0–0.2)

## 2023-07-06 LAB — IRON AND TIBC
Iron: 89 ug/dL (ref 28–170)
Saturation Ratios: 23 % (ref 10.4–31.8)
TIBC: 392 ug/dL (ref 250–450)
UIBC: 303 ug/dL

## 2023-07-06 LAB — CMP (CANCER CENTER ONLY)
ALT: 19 U/L (ref 0–44)
AST: 24 U/L (ref 15–41)
Albumin: 3.7 g/dL (ref 3.5–5.0)
Alkaline Phosphatase: 54 U/L (ref 38–126)
Anion gap: 11 (ref 5–15)
BUN: 21 mg/dL (ref 8–23)
CO2: 23 mmol/L (ref 22–32)
Calcium: 9.5 mg/dL (ref 8.9–10.3)
Chloride: 97 mmol/L — ABNORMAL LOW (ref 98–111)
Creatinine: 0.95 mg/dL (ref 0.44–1.00)
GFR, Estimated: >60 mL/min (ref >60)
Glucose, Bld: 100 mg/dL — ABNORMAL HIGH (ref 70–99)
Potassium: 3.7 mmol/L (ref 3.5–5.1)
Sodium: 131 mmol/L — ABNORMAL LOW (ref 135–145)
Total Bilirubin: 0.4 mg/dL (ref <1.2)
Total Protein: 6.5 g/dL (ref 6.5–8.1)

## 2023-07-06 LAB — FERRITIN: Ferritin: 22 ng/mL (ref 11–307)

## 2023-07-06 LAB — VITAMIN B12: Vitamin B-12: 595 pg/mL (ref 180–914)

## 2023-07-06 MED ORDER — IRON SUCROSE 20 MG/ML IV SOLN
200.0000 mg | Freq: Once | INTRAVENOUS | Status: AC
Start: 2023-07-06 — End: 2023-07-06
  Administered 2023-07-06: 200 mg via INTRAVENOUS
  Filled 2023-07-06: qty 10

## 2023-07-06 MED ORDER — SODIUM CHLORIDE 0.9% FLUSH
10.0000 mL | Freq: Once | INTRAVENOUS | Status: AC | PRN
  Administered 2023-07-06: 10 mL
  Filled 2023-07-06: qty 10

## 2023-07-06 NOTE — Assessment & Plan Note (Signed)
#   Iron def /gastric bypass [2015]-hemoglobin today is Hb 11.7-   Constipation sec to PO iron. Penidng-  iron levels today. Proceed with venofer today. On B12 gummies.   # breast cancer ER- stage I July 2024- mammo-WNL [[facility]]; No evidence of malignancy.- stable.   # vaginal atrophy: [Dr.Cindy Edmundsun; [facility]]- on vaginal estrogen- reasonable; no major concerns from breast cancer standpoint.  # Osteoporosis on prolia- BMD- NOV 2023- T score -2.1 [STABLE from 2020]; proceed with Prolia x6 years. Last dose today [NOv 2023]. No dental extraction.  Patient on vitamin D. Will repeat BMD in 2025/[facility] -ordered.   [facility] # DISPOSITION:  # B[facility] - [facility] # BMD in 6 months/[facility] # venofer today # Follow up in 6 months- MD; labs- cbc/cmp;Ca27-29;iron studies,ferritin;B12 levels- possible venofer-  Dr.B

## 2023-07-06 NOTE — Progress Notes (Signed)
Fatigue/weakness: no Dyspena: no Light headedness: no Blood in stool: no  Had a mammogram this past July at [facility].  Has trouble sleeping, recently rx'd trazodone.

## 2023-07-06 NOTE — Patient Instructions (Signed)
Iron Sucrose Injection What is this medication? IRON SUCROSE (EYE ern SOO krose) treats low levels of iron (iron deficiency anemia) in people with kidney disease. Iron is a mineral that plays an important role in making red blood cells, which carry oxygen from your lungs to the rest of your body. This medicine may be used for other purposes; ask your health care provider or pharmacist if you have questions. COMMON BRAND NAME(S): Venofer What should I tell my care team before I take this medication? They need to know if you have any of these conditions: Anemia not caused by low iron levels Heart disease High levels of iron in the blood Kidney disease Liver disease An unusual or allergic reaction to iron, other medications, foods, dyes, or preservatives Pregnant or trying to get pregnant Breastfeeding How should I use this medication? This medication is for infusion into a vein. It is given in a hospital or clinic setting. Talk to your care team about the use of this medication in children. While this medication may be prescribed for children as young as 2 years for selected conditions, precautions do apply. Overdosage: If you think you have taken too much of this medicine contact a poison control center or emergency room at once. NOTE: This medicine is only for you. Do not share this medicine with others. What if I miss a dose? Keep appointments for follow-up doses. It is important not to miss your dose. Call your care team if you are unable to keep an appointment. What may interact with this medication? Do not take this medication with any of the following: Deferoxamine Dimercaprol Other iron products This medication may also interact with the following: Chloramphenicol Deferasirox This list may not describe all possible interactions. Give your health care provider a list of all the medicines, herbs, non-prescription drugs, or dietary supplements you use. Also tell them if you smoke,  drink alcohol, or use illegal drugs. Some items may interact with your medicine. What should I watch for while using this medication? Visit your care team regularly. Tell your care team if your symptoms do not start to get better or if they get worse. You may need blood work done while you are taking this medication. You may need to follow a special diet. Talk to your care team. Foods that contain iron include: whole grains/cereals, dried fruits, beans, or peas, leafy green vegetables, and organ meats (liver, kidney). What side effects may I notice from receiving this medication? Side effects that you should report to your care team as soon as possible: Allergic reactions--skin rash, itching, hives, swelling of the face, lips, tongue, or throat Low blood pressure--dizziness, feeling faint or lightheaded, blurry vision Shortness of breath Side effects that usually do not require medical attention (report to your care team if they continue or are bothersome): Flushing Headache Joint pain Muscle pain Nausea Pain, redness, or irritation at injection site This list may not describe all possible side effects. Call your doctor for medical advice about side effects. You may report side effects to FDA at 1-800-FDA-1088. Where should I keep my medication? This medication is given in a hospital or clinic. It will not be stored at home. NOTE: This sheet is a summary. It may not cover all possible information. If you have questions about this medicine, talk to your doctor, pharmacist, or health care provider.  2024 Elsevier/Gold Standard (2022-12-10 00:00:00)

## 2023-07-06 NOTE — Progress Notes (Signed)
[facility] CONSULT NOTE  Patient Care Team: Damaris Schooner, DO as PCP - General (Family Medicine) Earna Coder, MD as Consulting Physician (Internal Medicine)  CHIEF COMPLAINTS/PURPOSE OF CONSULTATION: Iron deficiency  Breast cancer- stage I ERpositive; NO chemo [s/p lumpectomy s/p RT- Dr.Corcoran] anastraozle 5 an 1/2years;   BCI testing on 03/02/2016 revealed a 3.6% risk of late recurrence between years 5 through 10.  The likelihood of benefit of extended endocrine therapy is low.   Bone density on 03/15/2016 revealed osteoporosis with a T score of -2.7 in the left hip.  Bone density on 03/14/2018 revealed osteopenia with a T-score of -2.0 in the right femur.  Bone density on 03/17/2020 revealed osteopenia with a T score of -2.1 at the total right femur.  She received Prolia every 6 months (03/30/2016 - 05/29/2019). She received Zometa on 11/27/2019.   She has iron deficiency anemia s/p gastric bypass surgery (07/11/2014).  Ferritin was 6 with an iron saturation of 9% and a TIBC of 485 on 09/26/2018.  Diet is good.  She denies any bleeding.   HISTORY OF PRESENTING ILLNESS: Alone.  Ambulating independently.  Amanda Downs 72 y.o.  female history of breast cancer stage I ER/PR positive and HER2 negative-currently on surveillance; history of gastric bypass/iron deficiency; osteopenia on Prolia [off; last nov 2023] is here for follow-up.  Patient denies any symptoms.  Chronic mild fatigue.  Patient denies any bone pain.  Any nausea vomiting diarrhea fever chills.  Denies any loss of appetite or loss of energy.  Review of Systems  Constitutional:  Negative for chills, diaphoresis, fever, malaise/fatigue and weight loss.  HENT:  Negative for nosebleeds and sore throat.   Eyes:  Negative for double vision.  Respiratory:  Negative for cough, hemoptysis, sputum production, shortness of breath and wheezing.   Cardiovascular:  Negative for chest pain, palpitations,  orthopnea and leg swelling.  Gastrointestinal:  Negative for abdominal pain, blood in stool, constipation, diarrhea, heartburn, melena, nausea and vomiting.  Genitourinary:  Negative for dysuria, frequency and urgency.  Musculoskeletal:  Negative for back pain and joint pain.  Skin: Negative.  Negative for itching and rash.  Neurological:  Negative for dizziness, tingling, focal weakness, weakness and headaches.  Endo/Heme/Allergies:  Does not bruise/bleed easily.  Psychiatric/Behavioral:  Negative for depression. The patient is not nervous/anxious and does not have insomnia.      MEDICAL HISTORY:  Past Medical History:  Diagnosis Date   Bariatric surgery status    Cancer (HCC)    BREAST   CHF (congestive heart failure) (HCC)    Diabetes mellitus without complication (HCC)    GERD (gastroesophageal reflux disease)    Hyperlipemia    Hypertension    Mitral valve disorder    Ocular rosacea    Postsurgical malabsorption     SURGICAL HISTORY: Past Surgical History:  Procedure Laterality Date   ABDOMINAL HYSTERECTOMY     BREAST SURGERY Left    COLONOSCOPY WITH PROPOFOL N/A 03/17/2016   Procedure: COLONOSCOPY WITH PROPOFOL;  Surgeon: Scot Jun, MD;  Location: [facility];  Service: Endoscopy;  Laterality: N/A;   HERNIA REPAIR     ROUX-EN-Y GASTRIC BYPASS     TONSILLECTOMY     TUBAL LIGATION     VAGINAL PROLAPSE REPAIR  07/07/2022   at [facility]    SOCIAL HISTORY: Social History   Socioeconomic History   Marital status: Married    Spouse name: Not on file   Number of children: Not on  file   Years of education: Not on file   Highest education level: Not on file  Occupational History   Not on file  Tobacco Use   Smoking status: Never    Passive exposure: Yes   Smokeless tobacco: Never  Vaping Use   Vaping status: Never Used  Substance and Sexual Activity   Alcohol use: No    Alcohol/week: 0.0 standard drinks of alcohol   Drug use: No   Sexual activity: Not on  file  Other Topics Concern   Not on file  Social History Narrative   Not on file   Social Drivers of Health   Financial Resource Strain: Patient Declined (09/14/2022)   Received from [facility], [facility]   Overall Financial Resource Strain (CARDIA)    Difficulty of Paying Living Expenses: Patient declined  Food Insecurity: Patient Declined (09/14/2022)   Received from [facility], [facility]   Hunger Vital Sign    Worried About Running Out of Food in the Last Year: Patient declined    Ran Out of Food in the Last Year: Patient declined  Transportation Needs: Patient Declined (09/14/2022)   Received from [facility], [facility]   PRAPARE - Transportation    In the past 12 months, has lack of transportation kept you from medical appointments or from getting medications?: Patient declined    Lack of Transportation (Non-Medical): Patient declined  Physical Activity: Not on file  Stress: Not on file  Social Connections: Not on file  Intimate Partner Violence: Not on file    FAMILY HISTORY: Family History  Problem Relation Age of Onset   Colon cancer Mother    Stomach cancer Mother     ALLERGIES:  is allergic to lipitor [atorvastatin], rosuvastatin, and simvastatin.  MEDICATIONS:  Current Outpatient Medications  Medication Sig Dispense Refill   carvedilol (COREG) 6.25 MG tablet Take 6.25 mg by mouth 2 (two) times daily.     cyanocobalamin 2000 MCG tablet Take by mouth as needed.      denosumab (PROLIA) 60 MG/ML SOSY injection Inject into the skin.     desonide (DESOWEN) 0.05 % cream APPLY TO SCALY AREA RIGHT OF EAR TWO TIMES DAILY FOR 7 DAYS MAX     doxycycline (PERIOSTAT) 20 MG tablet Take 20 mg by mouth 2 (two) times daily.     estradiol (ESTRACE) 0.1 MG/GM vaginal cream Place vaginally.     furosemide (LASIX) 40 MG tablet Take 40 mg by mouth daily.       ketoconazole (NIZORAL) 2 % cream Apply 1 Application topically daily.     LINZESS 145 MCG CAPS capsule      magnesium oxide (MAG-OX) 400 MG tablet Take 400 mg by mouth daily.      omeprazole (PRILOSEC) 20 MG capsule TAKE 1 CAPSULE (20 MG TOTAL) BY MOUTH ONCE DAILY AS NEEDED     ramipril (ALTACE) 5 MG capsule Take 5 mg by mouth 2 (two) times daily.      saccharomyces boulardii (FLORASTOR) 250 MG capsule Take 250 mg by mouth daily.     spironolactone (ALDACTONE) 25 MG tablet Take 25 mg by mouth daily.      Vitamin D, Cholecalciferol, 400 UNITS CAPS Take 1 capsule by mouth daily.      ezetimibe (ZETIA) 10 MG tablet Take 10 mg by mouth daily.      levocetirizine (XYZAL) 5 MG tablet Take 5 mg by mouth as  needed. (Patient not taking: Reported on 06/23/2022)     metFORMIN (GLUCOPHAGE) 500 MG tablet Take 500 mg by mouth daily with breakfast.     triamcinolone (NASACORT) 55 MCG/ACT AERO nasal inhaler Place 1 spray into the nose daily.      No current facility-administered medications for this visit.      Marland Kitchen  PHYSICAL EXAMINATION:  Vitals:   07/06/23 1311  BP: 112/72  Pulse: 78  Temp: 97.7 F (36.5 C)  SpO2: 99%   Filed Weights   07/06/23 1311  Weight: 157 lb (71.2 kg)     Physical Exam Vitals and nursing note reviewed.  Constitutional:      Comments:     HENT:     Head: Normocephalic and atraumatic.     Mouth/Throat:     Mouth: Mucous membranes are moist.     Pharynx: No oropharyngeal exudate.  Eyes:     Pupils: Pupils are equal, round, and reactive to light.  Cardiovascular:     Rate and Rhythm: Normal rate and regular rhythm.  Pulmonary:     Effort: No respiratory distress.     Breath sounds: No wheezing.     Comments: Decreased breath sounds bilaterally at bases.  No wheeze or crackles Abdominal:     General: Bowel sounds are normal. There is no distension.     Palpations: Abdomen is soft. There is no mass.     Tenderness: There is no abdominal tenderness. There is no  guarding or rebound.  Musculoskeletal:        General: No tenderness. Normal range of motion.     Cervical back: Normal range of motion and neck supple.  Skin:    General: Skin is warm.  Neurological:     Mental Status: She is alert and oriented to person, place, and time.  Psychiatric:        Mood and Affect: Affect normal.        Judgment: Judgment normal.      LABORATORY DATA:  I have reviewed the data as listed Lab Results  Component Value Date   WBC 5.8 07/06/2023   HGB 11.7 (L) 07/06/2023   HCT 35.4 (L) 07/06/2023   MCV 91.2 07/06/2023   PLT 258 07/06/2023   Recent Labs    01/11/23 1421  NA 133*  K 4.6  CL 98  CO2 24  GLUCOSE 105*  BUN 19  CREATININE 1.11*  CALCIUM 9.4  GFRNONAA 53*  PROT 6.9  ALBUMIN 4.1  AST 23  ALT 19  ALKPHOS 43  BILITOT 0.6    RADIOGRAPHIC STUDIES: I have personally reviewed the radiological images as listed and agreed with the findings in the report. No results found.  Iron deficiency anemia following bariatric surgery # Iron def /gastric bypass [2015]-hemoglobin today is Hb 11.7-   Constipation sec to PO iron. Penidng-  iron levels today. Proceed with venofer today. On B12 gummies.   # breast cancer ER- stage I July 2024- mammo-WNL [[facility]]; No evidence of malignancy.- stable.   # vaginal atrophy: [Dr.Cindy Edmundsun; [facility]]- on vaginal estrogen- reasonable; no major concerns from breast cancer standpoint.  # Osteoporosis on prolia- BMD- NOV 2023- T score -2.1 [STABLE from 2020]; proceed with Prolia x6 years. Last dose today [NOv 2023]. No dental extraction.  Patient on vitamin D. Will repeat BMD in 2025/[facility] -ordered.   *[facility] # DISPOSITION:  # Bilateral mammogram - [facility] # BMD in 6 months/[facility] # venofer today # Follow up in  6 months- MD; labs- cbc/cmp;Ca27-29;iron studies,ferritin;B12 levels- possible venofer-  Dr.B  All questions were answered. The patient knows to call the clinic with any problems,  questions or concerns.    Earna Coder, MD 07/06/2023 1:32 PM

## 2023-07-07 LAB — CANCER ANTIGEN 27.29: CA 27.29: 17 U/mL (ref 0.0–38.6)

## 2023-07-08 ENCOUNTER — Ambulatory Visit: Payer: Medicare PPO | Admitting: Internal Medicine

## 2023-07-08 ENCOUNTER — Ambulatory Visit: Payer: Medicare PPO

## 2023-07-08 ENCOUNTER — Other Ambulatory Visit: Payer: Medicare PPO

## 2023-10-26 ENCOUNTER — Encounter: Payer: Self-pay | Admitting: Ophthalmology

## 2023-10-26 NOTE — Anesthesia Preprocedure Evaluation (Addendum)
 Anesthesia Evaluation  Patient identified by MRN, date of birth, ID band Patient awake    Reviewed: Allergy & Precautions, H&P , NPO status , Patient's Chart, lab work & pertinent test results  Airway Mallampati: III  TM Distance: >3 FB Neck ROM: Full    Dental no notable dental hx.    Pulmonary neg pulmonary ROS   Pulmonary exam normal breath sounds clear to auscultation       Cardiovascular hypertension, +CHF  Normal cardiovascular exam Rhythm:Regular Rate:Normal  Office note 09-29-23 Chronic systolic heart failure NYHA II: Last echo 02/21/2024 with EF 50%. Patient remains without shortness of breath, weight gain, leg and leg swelling. -GDMT: Carvedilol 6.25 mg twice daily, ramipril 5 mg twice daily, spironolactone 25 mg daily.     Neuro/Psych negative neurological ROS  negative psych ROS   GI/Hepatic negative GI ROS, Neg liver ROS,GERD  ,,  Endo/Other  diabetes    Renal/GU Renal diseasenegative Renal ROS  negative genitourinary   Musculoskeletal negative musculoskeletal ROS (+)    Abdominal   Peds negative pediatric ROS (+)  Hematology negative hematology ROS (+) Blood dyscrasia, anemia   Anesthesia Other Findings CHF (congestive heart failure) GERD (gastroesophageal reflux disease) Diabetes mellitus without complication (HCC) Mitral valve disorder Hypertension Hyperlipemia Bariatric surgery status  Postsurgical malabsorption Cancer Ocular rosacea Anemia  Chronic systolic (congestive) heart failure  Iron deficiency anemia following bariatric surgery  Controlled type 2 diabetes mellitus with diabetic nephropathy, without long-term current use of insulin (HCC) Dilated idiopathic cardiomyopathy (HCC)   Accucheck 89, will administer D50W and recheck in 15 minutes.  Plan 10 ml D50W IV    Reproductive/Obstetrics negative OB ROS                             Anesthesia  Physical Anesthesia Plan  ASA: 3  Anesthesia Plan: MAC   Post-op Pain Management:    Induction: Intravenous  PONV Risk Score and Plan:   Airway Management Planned: Natural Airway and Nasal Cannula  Additional Equipment:   Intra-op Plan:   Post-operative Plan:   Informed Consent: I have reviewed the patients History and Physical, chart, labs and discussed the procedure including the risks, benefits and alternatives for the proposed anesthesia with the patient or authorized representative who has indicated his/her understanding and acceptance.     Dental Advisory Given  Plan Discussed with: Anesthesiologist, CRNA and Surgeon  Anesthesia Plan Comments: (Patient consented for risks of anesthesia including but not limited to:  - adverse reactions to medications - damage to eyes, teeth, lips or other oral mucosa - nerve damage due to positioning  - sore throat or hoarseness - Damage to heart, brain, nerves, lungs, other parts of body or loss of life  Patient voiced understanding and assent.)        Anesthesia Quick Evaluation

## 2023-10-28 NOTE — Discharge Instructions (Signed)

## 2023-11-01 ENCOUNTER — Ambulatory Visit: Payer: Self-pay | Admitting: Anesthesiology

## 2023-11-01 ENCOUNTER — Encounter: Payer: Self-pay | Admitting: Ophthalmology

## 2023-11-01 ENCOUNTER — Other Ambulatory Visit: Payer: Self-pay

## 2023-11-01 ENCOUNTER — Ambulatory Visit
Admission: RE | Admit: 2023-11-01 | Discharge: 2023-11-01 | Disposition: A | Discharge status: Home / Self Care | Attending: Ophthalmology | Admitting: Ophthalmology

## 2023-11-01 ENCOUNTER — Encounter
Admission: RE | Disposition: A | Discharge status: Home / Self Care | Payer: Self-pay | Source: Home / Self Care | Attending: Ophthalmology

## 2023-11-01 DIAGNOSIS — I110 Hypertensive heart disease with heart failure: Secondary | ICD-10-CM | POA: Insufficient documentation

## 2023-11-01 DIAGNOSIS — H2511 Age-related nuclear cataract, right eye: Secondary | ICD-10-CM | POA: Insufficient documentation

## 2023-11-01 DIAGNOSIS — E1136 Type 2 diabetes mellitus with diabetic cataract: Secondary | ICD-10-CM | POA: Diagnosis not present

## 2023-11-01 DIAGNOSIS — I5022 Chronic systolic (congestive) heart failure: Secondary | ICD-10-CM | POA: Insufficient documentation

## 2023-11-01 DIAGNOSIS — Z7984 Long term (current) use of oral hypoglycemic drugs: Secondary | ICD-10-CM | POA: Insufficient documentation

## 2023-11-01 HISTORY — DX: Other complications of other bariatric procedure: K95.89

## 2023-11-01 HISTORY — PX: CATARACT EXTRACTION W/PHACO: SHX586

## 2023-11-01 HISTORY — DX: Type 2 diabetes mellitus with diabetic nephropathy: E11.21

## 2023-11-01 HISTORY — DX: Other iron deficiency anemias: D50.8

## 2023-11-01 HISTORY — DX: Chronic systolic (congestive) heart failure: I50.22

## 2023-11-01 HISTORY — DX: Anemia, unspecified: D64.9

## 2023-11-01 HISTORY — DX: Dilated cardiomyopathy: I42.0

## 2023-11-01 LAB — GLUCOSE, CAPILLARY
Glucose-Capillary: 120 mg/dL — ABNORMAL HIGH (ref 70–99)
Glucose-Capillary: 89 mg/dL (ref 70–99)
Glucose-Capillary: 116 mg/dL — ABNORMAL HIGH (ref 70–99)

## 2023-11-01 SURGERY — PHACOEMULSIFICATION, CATARACT, WITH IOL INSERTION
Anesthesia: Monitor Anesthesia Care | Site: Eye | Laterality: Right

## 2023-11-01 MED ORDER — FENTANYL CITRATE (PF) 100 MCG/2ML IJ SOLN
INTRAMUSCULAR | Status: DC | PRN
  Administered 2023-11-01: 50 ug via INTRAVENOUS

## 2023-11-01 MED ORDER — DEXTROSE 50 % IV SOLN
10.0000 mL | Freq: Once | INTRAVENOUS | Status: AC
  Administered 2023-11-01: 10 mL via INTRAVENOUS

## 2023-11-01 MED ORDER — MIDAZOLAM HCL 2 MG/2ML IJ SOLN
INTRAMUSCULAR | Status: DC | PRN
  Administered 2023-11-01 (×2): 1 mg via INTRAVENOUS

## 2023-11-01 MED ORDER — SIGHTPATH DOSE#1 NA CHONDROIT SULF-NA HYALURON 40-17 MG/ML IO SOLN
INTRAOCULAR | Status: DC | PRN
  Administered 2023-11-01: 1 mL via INTRAOCULAR

## 2023-11-01 MED ORDER — ARMC OPHTHALMIC DILATING DROPS
1.0000 | OPHTHALMIC | Status: DC | PRN
  Administered 2023-11-01 (×3): 1 via OPHTHALMIC

## 2023-11-01 MED ORDER — LIDOCAINE HCL (PF) 2 % IJ SOLN
INTRAOCULAR | Status: DC | PRN
  Administered 2023-11-01: 2 mL

## 2023-11-01 MED ORDER — MOXIFLOXACIN HCL 0.5 % OP SOLN
OPHTHALMIC | Status: DC | PRN
  Administered 2023-11-01: .2 mL via OPHTHALMIC

## 2023-11-01 MED ORDER — FENTANYL CITRATE (PF) 100 MCG/2ML IJ SOLN
INTRAMUSCULAR | Status: AC
  Filled 2023-11-01: qty 2

## 2023-11-01 MED ORDER — MIDAZOLAM HCL 2 MG/2ML IJ SOLN
INTRAMUSCULAR | Status: AC
  Filled 2023-11-01: qty 2

## 2023-11-01 MED ORDER — DEXTROSE 50 % IV SOLN
INTRAVENOUS | Status: AC
  Filled 2023-11-01: qty 50

## 2023-11-01 MED ORDER — SIGHTPATH DOSE#1 BSS IO SOLN
INTRAOCULAR | Status: DC | PRN
  Administered 2023-11-01: 15 mL via INTRAOCULAR

## 2023-11-01 MED ORDER — ARMC OPHTHALMIC DILATING DROPS
OPHTHALMIC | Status: AC
  Filled 2023-11-01: qty 0.5

## 2023-11-01 MED ORDER — BRIMONIDINE TARTRATE-TIMOLOL 0.2-0.5 % OP SOLN
OPHTHALMIC | Status: DC | PRN
  Administered 2023-11-01: 1 [drp] via OPHTHALMIC

## 2023-11-01 MED ORDER — TETRACAINE HCL 0.5 % OP SOLN
1.0000 [drp] | OPHTHALMIC | Status: DC | PRN
  Administered 2023-11-01 (×3): 1 [drp] via OPHTHALMIC

## 2023-11-01 MED ORDER — TETRACAINE HCL 0.5 % OP SOLN
OPHTHALMIC | Status: AC
  Filled 2023-11-01: qty 4

## 2023-11-01 MED ORDER — SIGHTPATH DOSE#1 BSS IO SOLN
INTRAOCULAR | Status: DC | PRN
  Administered 2023-11-01: 78 mL via OPHTHALMIC

## 2023-11-01 SURGICAL SUPPLY — 13 items
CATARACT SUITE SIGHTPATH (MISCELLANEOUS) ×1 IMPLANT
CYSTOTOME ANG REV CUT SHRT 25G (CUTTER) ×1 IMPLANT
CYSTOTOME ANGL RVRS SHRT 25G (CUTTER) ×1 IMPLANT
CYSTOTOME ANGL RVRS SHRT 25GA (CUTTER) ×1 IMPLANT
FEE CATARACT SUITE SIGHTPATH (MISCELLANEOUS) ×1 IMPLANT
GLOVE BIOGEL PI IND STRL 8 (GLOVE) ×1 IMPLANT
GLOVE SURG LX STRL 8.0 MICRO (GLOVE) ×1 IMPLANT
GLOVE SURG PROTEXIS BL SZ6.5 (GLOVE) ×1 IMPLANT
GLOVE SURG SYN 6.5 PF PI BL (GLOVE) ×1 IMPLANT
LENS IOL TECNIS EYHANCE 19.5 (Intraocular Lens) IMPLANT
NDL FILTER BLUNT 18X1 1/2 (NEEDLE) ×1 IMPLANT
NEEDLE FILTER BLUNT 18X1 1/2 (NEEDLE) ×1 IMPLANT
SYR 3ML LL SCALE MARK (SYRINGE) ×1 IMPLANT

## 2023-11-01 NOTE — Anesthesia Postprocedure Evaluation (Signed)
 Anesthesia Post Note  Patient: Amanda Downs  Procedure(s) Performed: PHACOEMULSIFICATION, CATARACT, WITH IOL INSERTION 9.04 01:12.5 (Right: Eye)  Patient location during evaluation: PACU Anesthesia Type: MAC Level of consciousness: awake and alert Pain management: pain level controlled Vital Signs Assessment: post-procedure vital signs reviewed and stable Respiratory status: spontaneous breathing, nonlabored ventilation, respiratory function stable and patient connected to nasal cannula oxygen Cardiovascular status: stable and blood pressure returned to baseline Postop Assessment: no apparent nausea or vomiting Anesthetic complications: no   No notable events documented.   Last Vitals:  Vitals:   11/01/23 0733 11/01/23 0839  BP: 114/70 98/67  Pulse: 65 62  Resp: 14 10  Temp: 36.5 C 36.7 C  SpO2: 100% 100%    Last Pain:  Vitals:   11/01/23 0733  TempSrc: Temporal  PainSc: 0-No pain                 Keyarra Rendall C Alfreda Hammad

## 2023-11-01 NOTE — Transfer of Care (Signed)
 Immediate Anesthesia Transfer of Care Note  Patient: Amanda Downs  Procedure(s) Performed: PHACOEMULSIFICATION, CATARACT, WITH IOL INSERTION 9.04 01:12.5 (Right: Eye)  Patient Location: PACU  Anesthesia Type: MAC  Level of Consciousness: awake, alert  and patient cooperative  Airway and Oxygen Therapy: Patient Spontanous Breathing and Patient connected to supplemental oxygen  Post-op Assessment: Post-op Vital signs reviewed, Patient's Cardiovascular Status Stable, Respiratory Function Stable, Patent Airway and No signs of Nausea or vomiting  Post-op Vital Signs: Reviewed and stable  Complications: No notable events documented.

## 2023-11-01 NOTE — H&P (Signed)
 [facility]   Primary Care Physician:  Judye Numbers, DO Ophthalmologist: Dr. Jeb Miner  Pre-Procedure History & Physical: HPI:  Amanda Downs is a 73 y.o. female here for cataract surgery.   Past Medical History:  Diagnosis Date   Anemia    Bariatric surgery status    Cancer (HCC)    BREAST   CHF (congestive heart failure) (HCC)    Chronic systolic (congestive) heart failure (HCC)    Controlled type 2 diabetes mellitus with diabetic nephropathy, without long-term current use of insulin (HCC)    Diabetes mellitus without complication (HCC)    Dilated idiopathic cardiomyopathy (HCC)    GERD (gastroesophageal reflux disease)    Hyperlipemia    Hypertension    Iron deficiency anemia following bariatric surgery    Mitral valve disorder    Ocular rosacea    Postsurgical malabsorption     Past Surgical History:  Procedure Laterality Date   ABDOMINAL HYSTERECTOMY     BREAST SURGERY Left    COLONOSCOPY WITH PROPOFOL N/A 03/17/2016   Procedure: COLONOSCOPY WITH PROPOFOL;  Surgeon: Cassie Click, MD;  Location: [facility] ENDOSCOPY;  Service: Endoscopy;  Laterality: N/A;   HERNIA REPAIR     ROUX-EN-Y GASTRIC BYPASS     TONSILLECTOMY     TUBAL LIGATION     VAGINAL PROLAPSE REPAIR  07/07/2022   at [facility]    Prior to Admission medications   Medication Sig Start Date End Date Taking? Authorizing Provider  Berberine Chloride (BERBERINE HCI PO) Take by mouth at bedtime.   Yes [provider]  carvedilol (COREG) 6.25 MG tablet Take 6.25 mg by mouth 2 (two) times daily. 01/18/16  Yes [provider]  cyanocobalamin 2000 MCG tablet Take by mouth as needed.    Yes [provider]  denosumab (PROLIA) 60 MG/ML SOSY injection Inject into the skin.   Yes [provider]  desonide (DESOWEN) 0.05 % cream APPLY TO SCALY AREA RIGHT OF EAR TWO TIMES DAILY FOR 7 DAYS MAX 04/08/19  Yes [provider]  doxycycline (PERIOSTAT) 20 MG tablet Take 20 mg by  mouth 2 (two) times daily.   Yes [provider]  estradiol (ESTRACE) 0.1 MG/GM vaginal cream Place vaginally. 06/03/22 03/12/32 Yes [provider]  ezetimibe (ZETIA) 10 MG tablet Take 10 mg by mouth daily.  11/18/14 10/26/23 Yes [provider]  furosemide (LASIX) 40 MG tablet Take 40 mg by mouth daily.  07/12/14  Yes [provider]  ketoconazole (NIZORAL) 2 % cream Apply 1 Application topically daily. 02/22/23  Yes [provider]  LINZESS 145 MCG CAPS capsule  12/21/18  Yes [provider]  magnesium oxide (MAG-OX) 400 MG tablet Take 400 mg by mouth daily.    Yes [provider]  metFORMIN (GLUCOPHAGE) 500 MG tablet Take 500 mg by mouth 2 (two) times daily with a meal. 08/20/14 10/26/23 Yes [provider]  Multiple Vitamin (MULTIVITAMIN) tablet Take 1 tablet by mouth daily.   Yes [provider]  Omega-3 Fatty Acids (FISH OIL PO) Take by mouth.   Yes [provider]  omeprazole (PRILOSEC) 20 MG capsule TAKE 1 CAPSULE (20 MG TOTAL) BY MOUTH ONCE DAILY AS NEEDED 08/12/20  Yes [provider]  OVER THE COUNTER MEDICATION daily. Lipozem   Yes [provider]  ramipril (ALTACE) 5 MG capsule Take 5 mg by mouth 2 (two) times daily.  01/18/16  Yes [provider]  spironolactone (ALDACTONE) 25 MG tablet Take  25 mg by mouth daily.  07/12/14  Yes [provider]  traZODone (DESYREL) 50 MG tablet Take 50 mg by mouth at bedtime as needed for sleep.   Yes [provider]  triamcinolone (NASACORT) 55 MCG/ACT AERO nasal inhaler Place 1 spray into the nose daily.  05/23/17 10/26/23 Yes [provider]  triamcinolone cream (KENALOG) 0.1 % Apply 1 Application topically as needed.   Yes [provider]  Vitamin D, Cholecalciferol, 400 UNITS CAPS Take 1 capsule by mouth daily.    Yes [provider]  VITAMIN E PO Take by mouth.   Yes [provider]   levocetirizine (XYZAL) 5 MG tablet Take 5 mg by mouth as needed. Patient not taking: Reported on 06/23/2022 08/23/17 06/23/21  [provider]    Allergies as of 10/19/2023 - Review Complete 07/06/2023  Allergen Reaction Noted   Lipitor [atorvastatin] Other (See Comments) 03/16/2016   Rosuvastatin  01/20/2015   Simvastatin Other (See Comments) 03/16/2016    Family History  Problem Relation Age of Onset   Colon cancer Mother    Stomach cancer Mother     Social History   Socioeconomic History   Marital status: Married    Spouse name: Not on file   Number of children: Not on file   Years of education: Not on file   Highest education level: Not on file  Occupational History   Not on file  Tobacco Use   Smoking status: Never    Passive exposure: Yes   Smokeless tobacco: Never  Vaping Use   Vaping status: Never Used  Substance and Sexual Activity   Alcohol use: Yes    Alcohol/week: 1.0 standard drink of alcohol    Types: 1 Glasses of wine per week   Drug use: No   Sexual activity: Not on file  Other Topics Concern   Not on file  Social History Narrative   Not on file   Social Drivers of Health   Financial Resource Strain: Low Risk  (09/18/2023)   Received from [facility]   Overall Financial Resource Strain (CARDIA)    Difficulty of Paying Living Expenses: Not very hard  Food Insecurity: Patient Declined (09/18/2023)   Received [facility]   Hunger Vital Sign    Worried About Running Out of Food in the Last Year: Patient declined    Ran Out of Food in the Last Year: Patient declined  Transportation Needs: No Transportation Needs (09/18/2023)   Received from [facility] - Transportation    In the past 12 months, has lack of transportation kept you from medical appointments or from getting medications?: No    Lack of Transportation (Non-Medical): No  Physical Activity: Not on file  Stress: Not  on file  Social Connections: Not on file  Intimate Partner Violence: Not on file    Review of Systems: See HPI, otherwise negative ROS  Physical Exam: BP 114/70   Pulse 65   Temp 97.7 F (36.5 C) (Temporal)   Resp 14   Ht 5\' 4"  (1.626 m)   Wt 73.6 kg   SpO2 100%   BMI 27.84 kg/m  General:   Alert, cooperative. Head:  Normocephalic and atraumatic. Respiratory:  Normal work of breathing. Cardiovascular:  NAD  Impression/Plan: Amanda Downs is here for cataract surgery.  Risks, benefits, limitations, and alternatives regarding cataract surgery have been reviewed with the patient.  Questions have been answered.  All parties agreeable.   Clair Crews, MD  11/01/2023, 8:14 AM

## 2023-11-01 NOTE — Op Note (Signed)
 PREOPERATIVE DIAGNOSIS:  Nuclear sclerotic cataract of the right eye.   POSTOPERATIVE DIAGNOSIS:  Right Eye Cataract   OPERATIVE PROCEDURE:ORPROCALL@   SURGEON:  Clair Crews, MD.   ANESTHESIA:  Anesthesiologist: Emilie Harden, MD CRNA: Jahoo, Sonia, CRNA  1.      Managed anesthesia care. 2.      0.88ml of Shugarcaine was instilled in the eye following the paracentesis.   COMPLICATIONS:  None.   TECHNIQUE:   Stop and chop   DESCRIPTION OF PROCEDURE:  The patient was examined and consented in the preoperative holding area where the aforementioned topical anesthesia was applied to the right eye and then brought back to the Operating Room where the right eye was prepped and draped in the usual sterile ophthalmic fashion and a lid speculum was placed. A paracentesis was created with the side port blade and the anterior chamber was filled with viscoelastic. A near clear corneal incision was performed with the steel keratome. A continuous curvilinear capsulorrhexis was performed with a cystotome followed by the capsulorrhexis forceps. Hydrodissection and hydrodelineation were carried out with BSS on a blunt cannula. The lens was removed in a stop and chop  technique and the remaining cortical material was removed with the irrigation-aspiration handpiece. The capsular bag was inflated with viscoelastic and the Technis [id number]  lens was placed in the capsular bag without complication. The remaining viscoelastic was removed from the eye with the irrigation-aspiration handpiece. The wounds were hydrated. The anterior chamber was flushed with BSS and the eye was inflated to physiologic pressure. 0.1ml of Vigamox was placed in the anterior chamber. The wounds were found to be water tight. The eye was dressed with Combigan. The patient was given protective glasses to wear throughout the day and a shield with which to sleep tonight. The patient was also given drops with which to begin a drop regimen today  and will follow-up with me in one day. Implant Name Type Inv. Item Serial No. Manufacturer Lot No. LRB No. Used Action  LENS IOL TECNIS EYHANCE 19.5 - [id number] Intraocular Lens LENS IOL TECNIS EYHANCE 19.5 [id number] [employer]  Right 1 Implanted   Procedure(s): PHACOEMULSIFICATION, CATARACT, WITH IOL INSERTION 9.04 01:12.5 (Right)  Electronically signed: Clair Crews 11/01/2023 8:37 AM

## 2023-11-08 NOTE — Anesthesia Preprocedure Evaluation (Addendum)
 Anesthesia Evaluation  Patient identified by MRN, date of birth, ID band Patient awake    Reviewed: Allergy & Precautions, H&P , NPO status , Patient's Chart, lab work & pertinent test results  Airway Mallampati: III  TM Distance: <3 FB Neck ROM: Full    Dental no notable dental hx.    Pulmonary neg pulmonary ROS   Pulmonary exam normal breath sounds clear to auscultation       Cardiovascular hypertension, +CHF  negative cardio ROS Normal cardiovascular exam Rhythm:Regular Rate:Normal    Office note 09-29-23 Chronic systolic heart failure NYHA II: Last echo 02/21/2024 with EF 50%. Patient remains without shortness of breath, weight gain, leg and leg swelling. -GDMT: Carvedilol  6.25 mg twice daily, ramipril  5 mg twice daily, spironolactone  25 mg daily.       Neuro/Psych negative neurological ROS  negative psych ROS   GI/Hepatic negative GI ROS, Neg liver ROS,GERD  ,,  Endo/Other  negative endocrine ROSdiabetes    Renal/GU Renal diseasenegative Renal ROS  negative genitourinary   Musculoskeletal negative musculoskeletal ROS (+)    Abdominal   Peds negative pediatric ROS (+)  Hematology negative hematology ROS (+) Blood dyscrasia, anemia   Anesthesia Other Findings Previous cataract surgery 11-01-23 Dr. 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, without long-term current use of insulin (HCC) Dilated idiopathic cardiomyopathy (HCC)      Reproductive/Obstetrics negative OB ROS                              Anesthesia Physical Anesthesia Plan  ASA: 3  Anesthesia Plan: MAC   Post-op Pain Management:    Induction: Intravenous  PONV Risk Score and Plan:   Airway Management Planned: Natural Airway and Nasal Cannula  Additional Equipment:   Intra-op Plan:   Post-operative Plan:   Informed Consent: I have reviewed the patients History and Physical, chart, labs and discussed the procedure including the risks, benefits and alternatives for the proposed anesthesia with the patient or authorized representative who has indicated his/her understanding and acceptance.     Dental Advisory Given  Plan Discussed with: Anesthesiologist, CRNA and Surgeon  Anesthesia Plan Comments: (Patient consented for risks of anesthesia including but not limited to:  - adverse reactions to medications - damage to eyes, teeth, lips or other oral mucosa - nerve damage due to positioning  - sore throat or hoarseness - Damage to heart, brain, nerves, lungs, other parts of body or loss of life  Patient voiced understanding and assent.)        Anesthesia Quick Evaluation

## 2023-11-09 NOTE — Discharge Instructions (Signed)

## 2023-11-15 ENCOUNTER — Ambulatory Visit: Admitting: Anesthesiology

## 2023-11-15 ENCOUNTER — Other Ambulatory Visit: Payer: Self-pay

## 2023-11-15 ENCOUNTER — Ambulatory Visit
Admission: RE | Admit: 2023-11-15 | Discharge: 2023-11-15 | Disposition: A | Discharge status: Home / Self Care | Attending: Ophthalmology | Admitting: Ophthalmology

## 2023-11-15 ENCOUNTER — Encounter: Payer: Self-pay | Admitting: Ophthalmology

## 2023-11-15 ENCOUNTER — Encounter
Admission: RE | Disposition: A | Discharge status: Home / Self Care | Payer: Self-pay | Source: Home / Self Care | Attending: Ophthalmology

## 2023-11-15 DIAGNOSIS — N289 Disorder of kidney and ureter, unspecified: Secondary | ICD-10-CM | POA: Insufficient documentation

## 2023-11-15 DIAGNOSIS — Z7984 Long term (current) use of oral hypoglycemic drugs: Secondary | ICD-10-CM | POA: Diagnosis not present

## 2023-11-15 DIAGNOSIS — K219 Gastro-esophageal reflux disease without esophagitis: Secondary | ICD-10-CM | POA: Insufficient documentation

## 2023-11-15 DIAGNOSIS — E119 Type 2 diabetes mellitus without complications: Secondary | ICD-10-CM | POA: Insufficient documentation

## 2023-11-15 DIAGNOSIS — E1136 Type 2 diabetes mellitus with diabetic cataract: Secondary | ICD-10-CM | POA: Insufficient documentation

## 2023-11-15 DIAGNOSIS — I5022 Chronic systolic (congestive) heart failure: Secondary | ICD-10-CM | POA: Diagnosis not present

## 2023-11-15 DIAGNOSIS — H2512 Age-related nuclear cataract, left eye: Secondary | ICD-10-CM | POA: Insufficient documentation

## 2023-11-15 DIAGNOSIS — I110 Hypertensive heart disease with heart failure: Secondary | ICD-10-CM | POA: Diagnosis not present

## 2023-11-15 HISTORY — PX: CATARACT EXTRACTION W/PHACO: SHX586

## 2023-11-15 LAB — GLUCOSE, CAPILLARY: Glucose-Capillary: 88 mg/dL (ref 70–99)

## 2023-11-15 SURGERY — PHACOEMULSIFICATION, CATARACT, WITH IOL INSERTION
Anesthesia: Monitor Anesthesia Care | Site: Eye | Laterality: Left

## 2023-11-15 MED ORDER — FENTANYL CITRATE (PF) 100 MCG/2ML IJ SOLN
INTRAMUSCULAR | Status: DC | PRN
  Administered 2023-11-15: 50 ug via INTRAVENOUS

## 2023-11-15 MED ORDER — MOXIFLOXACIN HCL 0.5 % OP SOLN
OPHTHALMIC | Status: DC | PRN
  Administered 2023-11-15: .2 mL via OPHTHALMIC

## 2023-11-15 MED ORDER — FENTANYL CITRATE (PF) 100 MCG/2ML IJ SOLN
INTRAMUSCULAR | Status: AC
Start: 2023-11-15 — End: ?
  Filled 2023-11-15: qty 2

## 2023-11-15 MED ORDER — ARMC OPHTHALMIC DILATING DROPS
1.0000 | OPHTHALMIC | Status: DC | PRN
  Administered 2023-11-15 (×3): 1 via OPHTHALMIC

## 2023-11-15 MED ORDER — SIGHTPATH DOSE#1 BSS IO SOLN
INTRAOCULAR | Status: DC | PRN
  Administered 2023-11-15: 66 mL via OPHTHALMIC

## 2023-11-15 MED ORDER — SIGHTPATH DOSE#1 BSS IO SOLN
INTRAOCULAR | Status: DC | PRN
  Administered 2023-11-15: 15 mL via INTRAOCULAR

## 2023-11-15 MED ORDER — LIDOCAINE HCL (PF) 2 % IJ SOLN
INTRAOCULAR | Status: DC | PRN
  Administered 2023-11-15: 2 mL

## 2023-11-15 MED ORDER — TETRACAINE HCL 0.5 % OP SOLN
OPHTHALMIC | Status: AC
  Filled 2023-11-15: qty 4

## 2023-11-15 MED ORDER — SIGHTPATH DOSE#1 NA CHONDROIT SULF-NA HYALURON 40-17 MG/ML IO SOLN
INTRAOCULAR | Status: DC | PRN
  Administered 2023-11-15: 1 mL via INTRAOCULAR

## 2023-11-15 MED ORDER — SODIUM CHLORIDE 0.9% FLUSH
INTRAVENOUS | Status: DC | PRN
  Administered 2023-11-15: 10 mL via INTRAVENOUS

## 2023-11-15 MED ORDER — MIDAZOLAM HCL 2 MG/2ML IJ SOLN
INTRAMUSCULAR | Status: AC
  Filled 2023-11-15: qty 2

## 2023-11-15 MED ORDER — MIDAZOLAM HCL 2 MG/2ML IJ SOLN
INTRAMUSCULAR | Status: DC | PRN
  Administered 2023-11-15: 1 mg via INTRAVENOUS

## 2023-11-15 MED ORDER — BRIMONIDINE TARTRATE-TIMOLOL 0.2-0.5 % OP SOLN
OPHTHALMIC | Status: DC | PRN
  Administered 2023-11-15: 1 [drp] via OPHTHALMIC

## 2023-11-15 MED ORDER — ARMC OPHTHALMIC DILATING DROPS
OPHTHALMIC | Status: AC
  Filled 2023-11-15: qty 0.5

## 2023-11-15 MED ORDER — TETRACAINE HCL 0.5 % OP SOLN
1.0000 [drp] | OPHTHALMIC | Status: DC | PRN
  Administered 2023-11-15 (×3): 1 [drp] via OPHTHALMIC

## 2023-11-15 SURGICAL SUPPLY — 12 items
CATARACT SUITE SIGHTPATH (MISCELLANEOUS) ×1 IMPLANT
CYSTOTOME ANGL RVRS SHRT 25G (CUTTER) ×1 IMPLANT
CYSTOTOME ANGL RVRS SHRT 25GA (CUTTER) ×1 IMPLANT
FEE CATARACT SUITE SIGHTPATH (MISCELLANEOUS) ×1 IMPLANT
GLOVE BIOGEL PI IND STRL 8 (GLOVE) ×1 IMPLANT
GLOVE SURG LX STRL 8.0 MICRO (GLOVE) ×1 IMPLANT
GLOVE SURG PROTEXIS BL SZ6.5 (GLOVE) ×1 IMPLANT
GLOVE SURG SYN 6.5 PF PI BL (GLOVE) ×1 IMPLANT
LENS IOL TECNIS EYHANCE 20.0 (Intraocular Lens) IMPLANT
NDL FILTER BLUNT 18X1 1/2 (NEEDLE) ×1 IMPLANT
NEEDLE FILTER BLUNT 18X1 1/2 (NEEDLE) ×1 IMPLANT
SYR 3ML LL SCALE MARK (SYRINGE) ×1 IMPLANT

## 2023-11-15 NOTE — Op Note (Signed)
 PREOPERATIVE DIAGNOSIS:  Nuclear sclerotic cataract of the left eye.   POSTOPERATIVE DIAGNOSIS:  Nuclear sclerotic cataract of the left eye.   OPERATIVE PROCEDURE:ORPROCALL@   SURGEON:  Clair Crews, MD.   ANESTHESIA:  Anesthesiologist: Emilie Harden, MD CRNA: Karan Osgood, CRNA  1.      Managed anesthesia care. 2.     0.21ml of Shugarcaine was instilled following the paracentesis   COMPLICATIONS:  None.   TECHNIQUE:   Stop and chop   DESCRIPTION OF PROCEDURE:  The patient was examined and consented in the preoperative holding area where the aforementioned topical anesthesia was applied to the left eye and then brought back to the Operating Room where the left eye was prepped and draped in the usual sterile ophthalmic fashion and a lid speculum was placed. A paracentesis was created with the side port blade and the anterior chamber was filled with viscoelastic. A near clear corneal incision was performed with the steel keratome. A continuous curvilinear capsulorrhexis was performed with a cystotome followed by the capsulorrhexis forceps. Hydrodissection and hydrodelineation were carried out with BSS on a blunt cannula. The lens was removed in a stop and chop  technique and the remaining cortical material was removed with the irrigation-aspiration handpiece. The capsular bag was inflated with viscoelastic and the Technis [id number] lens was placed in the capsular bag without complication. The remaining viscoelastic was removed from the eye with the irrigation-aspiration handpiece. The wounds were hydrated. The anterior chamber was flushed with BSS and the eye was inflated to physiologic pressure. 0.41ml Vigamox  was placed in the anterior chamber. The wounds were found to be water tight. The eye was dressed with Combigan . The patient was given protective glasses to wear throughout the day and a shield with which to sleep tonight. The patient was also given drops with which to begin a drop regimen  today and will follow-up with me in one day. Implant Name Type Inv. Item Serial No. Manufacturer Lot No. LRB No. Used Action  LENS IOL TECNIS EYHANCE 20.0 - [id number] Intraocular Lens LENS IOL TECNIS EYHANCE 20.0 [id number] [employer]  Left 1 Implanted    Procedure(s): PHACOEMULSIFICATION, CATARACT, WITH IOL INSERTION 7.10 00:46.5 (Left)  Electronically signed: Clair Crews 11/15/2023 7:48 AM

## 2023-11-15 NOTE — Transfer of Care (Signed)
 Immediate Anesthesia Transfer of Care Note  Patient: Amanda Downs  Procedure(s) Performed: PHACOEMULSIFICATION, CATARACT, WITH IOL INSERTION 7.10 00:46.5 (Left: Eye)  Patient Location: PACU  Anesthesia Type:MAC  Level of Consciousness: awake, alert , and oriented  Airway & Oxygen Therapy: Patient Spontanous Breathing  Post-op Assessment: Report given to RN and Post -op Vital signs reviewed and stable  Post vital signs: Reviewed and stable  Last Vitals:  Vitals Value Taken Time  BP 101/77 11/15/23 0750  Temp 36.1 C 11/15/23 0750  Pulse 65 11/15/23 0751  Resp 15 11/15/23 0751  SpO2 100 % 11/15/23 0751  Vitals shown include unfiled device data.  Last Pain:  Vitals:   11/15/23 0750  TempSrc:   PainSc: 0-No pain         Complications: No notable events documented.

## 2023-11-15 NOTE — H&P (Signed)
 [facility]   Primary Care Physician:  Judye Numbers, DO Ophthalmologist: Dr. Merrell Abate  Pre-Procedure History & Physical: HPI:  Amanda Downs is a 73 y.o. female here for cataract surgery.   Past Medical History:  Diagnosis Date   Anemia    Bariatric surgery status    Cancer (HCC)    BREAST   CHF (congestive heart failure) (HCC)    Chronic systolic (congestive) heart failure (HCC)    Controlled type 2 diabetes mellitus with diabetic nephropathy, without long-term current use of insulin (HCC)    Diabetes mellitus without complication (HCC)    Dilated idiopathic cardiomyopathy (HCC)    GERD (gastroesophageal reflux disease)    Hyperlipemia    Hypertension    Iron  deficiency anemia following bariatric surgery    Mitral valve disorder    Ocular rosacea    Postsurgical malabsorption     Past Surgical History:  Procedure Laterality Date   ABDOMINAL HYSTERECTOMY     BREAST SURGERY Left    CATARACT EXTRACTION W/PHACO Right 11/01/2023   Procedure: PHACOEMULSIFICATION, CATARACT, WITH IOL INSERTION 9.04 01:12.5;  Surgeon: Clair Crews, MD;  Location: [facility];  Service: Ophthalmology;  Laterality: Right;   COLONOSCOPY WITH PROPOFOL  N/A 03/17/2016   Procedure: COLONOSCOPY WITH PROPOFOL ;  Surgeon: Cassie Click, MD;  Location: [facility];  Service: Endoscopy;  Laterality: N/A;   HERNIA REPAIR     ROUX-EN-Y GASTRIC BYPASS     TONSILLECTOMY     TUBAL LIGATION     VAGINAL PROLAPSE REPAIR  07/07/2022   at [facility]    Prior to Admission medications   Medication Sig Start Date End Date Taking? Authorizing Provider  Berberine Chloride (BERBERINE HCI PO) Take by mouth at bedtime.   Yes [provider]  carvedilol  (COREG ) 6.25 MG tablet Take 6.25 mg by mouth 2 (two) times daily. 01/18/16  Yes [provider]  cyanocobalamin  2000 MCG tablet Take by mouth as needed.    Yes [provider]  denosumab  (PROLIA ) 60 MG/ML SOSY injection Inject  into the skin.   Yes [provider]  doxycycline (PERIOSTAT) 20 MG tablet Take 20 mg by mouth 2 (two) times daily.   Yes [provider]  estradiol (ESTRACE) 0.1 MG/GM vaginal cream Place vaginally. 06/03/22 03/12/32 Yes [provider]  furosemide  (LASIX ) 40 MG tablet Take 40 mg by mouth daily.  07/12/14  Yes [provider]  LINZESS 145 MCG CAPS capsule  12/21/18  Yes [provider]  magnesium oxide (MAG-OX) 400 MG tablet Take 400 mg by mouth daily.    Yes [provider]  metFORMIN  (GLUCOPHAGE ) 500 MG tablet Take 500 mg by mouth 2 (two) times daily with a meal. 08/20/14 11/15/23 Yes [provider]  Multiple Vitamin (MULTIVITAMIN) tablet Take 1 tablet by mouth daily.   Yes [provider]  Omega-3 Fatty Acids (FISH OIL PO) Take by mouth.   Yes [provider]  omeprazole (PRILOSEC) 20 MG capsule TAKE 1 CAPSULE (20 MG TOTAL) BY MOUTH ONCE DAILY AS NEEDED 08/12/20  Yes [provider]  OVER THE COUNTER MEDICATION daily. Lipozem   Yes [provider]  ramipril  (ALTACE ) 5 MG capsule Take 5 mg by mouth 2 (two) times daily.  01/18/16  Yes [provider]  spironolactone  (ALDACTONE ) 25 MG tablet Take 25 mg by mouth daily.  07/12/14  Yes [provider]  traZODone (DESYREL) 50 MG tablet Take 50 mg by mouth at bedtime as needed for sleep.  Yes [provider]  Vitamin D, Cholecalciferol, 400 UNITS CAPS Take 1 capsule by mouth daily.    Yes [provider]  VITAMIN E PO Take by mouth.   Yes [provider]  desonide (DESOWEN) 0.05 % cream APPLY TO SCALY AREA RIGHT OF EAR TWO TIMES DAILY FOR 7 DAYS MAX 04/08/19   [provider]  ezetimibe  (ZETIA ) 10 MG tablet Take 10 mg by mouth daily.  11/18/14 10/26/23  [provider]  ketoconazole (NIZORAL) 2 % cream Apply 1 Application topically daily. 02/22/23   [provider]  levocetirizine (XYZAL ) 5 MG  tablet Take 5 mg by mouth as needed. Patient not taking: Reported on 06/23/2022 08/23/17 06/23/21  [provider]  triamcinolone  (NASACORT) 55 MCG/ACT AERO nasal inhaler Place 1 spray into the nose daily.  05/23/17 10/26/23  [provider]  triamcinolone  cream (KENALOG) 0.1 % Apply 1 Application topically as needed.    [provider]    Allergies as of 10/19/2023 - Review Complete 07/06/2023  Allergen Reaction Noted   Lipitor [atorvastatin] Other (See Comments) 03/16/2016   Rosuvastatin  01/20/2015   Simvastatin Other (See Comments) 03/16/2016    Family History  Problem Relation Age of Onset   Colon cancer Mother    Stomach cancer Mother     Social History   Socioeconomic History   Marital status: Married    Spouse name: Not on file   Number of children: Not on file   Years of education: Not on file   Highest education level: Not on file  Occupational History   Not on file  Tobacco Use   Smoking status: Never    Passive exposure: Yes   Smokeless tobacco: Never  Vaping Use   Vaping status: Never Used  Substance and Sexual Activity   Alcohol use: Yes    Alcohol/week: 1.0 standard drink of alcohol    Types: 1 Glasses of wine per week   Drug use: No   Sexual activity: Not on file  Other Topics Concern   Not on file  Social History Narrative   Not on file   Social Drivers of Health   Financial Resource Strain: Low Risk  (09/18/2023)   Received from [facility]   Overall Financial Resource Strain (CARDIA)    Difficulty of Paying Living Expenses: Not very hard  Food Insecurity: Patient Declined (09/18/2023)   Received from [facility]   Hunger Vital Sign    Worried About Running Out of Food in the Last Year: Patient declined    Ran Out of Food in the Last Year: Patient declined  Transportation Needs: No Transportation Needs (09/18/2023)   Received from [facility] - Transportation     In the past 12 months, has lack of transportation kept you from medical appointments or from getting medications?: No    Lack of Transportation (Non-Medical): No  Physical Activity: Not on file  Stress: Not on file  Social Connections: Not on file  Intimate Partner Violence: Not on file    Review of Systems: See HPI, otherwise negative ROS  Physical Exam: BP 118/75   Pulse 66   Temp (!) 97.2 F (36.2 C) (Temporal)   Wt 70.8 kg   SpO2 100%   BMI 26.78 kg/m  General:   Alert, cooperative. Head:  Normocephalic and atraumatic. Respiratory:  Normal work of breathing. Cardiovascular:  NAD  Impression/Plan: Amanda Downs is here for cataract surgery.  Risks, benefits, limitations, and alternatives regarding cataract surgery have been reviewed with the patient.  Questions have been answered.  All parties agreeable.   Clair Crews, MD  11/15/2023, 7:17 AM

## 2023-11-15 NOTE — Anesthesia Postprocedure Evaluation (Signed)
 Anesthesia Post Note  Patient: Amanda Downs  Procedure(s) Performed: PHACOEMULSIFICATION, CATARACT, WITH IOL INSERTION 7.10 00:46.5 (Left: Eye)  Patient location during evaluation: PACU Anesthesia Type: MAC Level of consciousness: awake and alert Pain management: pain level controlled Vital Signs Assessment: post-procedure vital signs reviewed and stable Respiratory status: spontaneous breathing, nonlabored ventilation, respiratory function stable and patient connected to nasal cannula oxygen Cardiovascular status: stable and blood pressure returned to baseline Postop Assessment: no apparent nausea or vomiting Anesthetic complications: no   No notable events documented.   Last Vitals:  Vitals:   11/15/23 0750 11/15/23 0755  BP: 101/77 107/63  Pulse: 61 61  Resp: 10 12  Temp: (!) 36.1 C (!) 36.1 C  SpO2: 100% 99%    Last Pain:  Vitals:   11/15/23 0755  TempSrc:   PainSc: 0-No pain                 Emilie Harden

## 2024-01-04 ENCOUNTER — Other Ambulatory Visit: Payer: Medicare PPO

## 2024-01-04 ENCOUNTER — Ambulatory Visit: Payer: Medicare PPO | Admitting: Internal Medicine

## 2024-01-04 ENCOUNTER — Ambulatory Visit: Payer: Medicare PPO

## 2024-01-05 ENCOUNTER — Ambulatory Visit
Admission: RE | Admit: 2024-01-05 | Discharge: 2024-01-05 | Disposition: A | Discharge status: Home / Self Care | Payer: Medicare PPO | Source: Ambulatory Visit | Attending: Internal Medicine | Admitting: Internal Medicine

## 2024-01-05 DIAGNOSIS — Z170 Estrogen receptor positive status [ER+]: Secondary | ICD-10-CM | POA: Insufficient documentation

## 2024-01-05 DIAGNOSIS — M85851 Other specified disorders of bone density and structure, right thigh: Secondary | ICD-10-CM | POA: Diagnosis present

## 2024-01-05 DIAGNOSIS — C50912 Malignant neoplasm of unspecified site of left female breast: Secondary | ICD-10-CM | POA: Diagnosis present

## 2024-01-11 ENCOUNTER — Encounter: Payer: Self-pay | Admitting: Internal Medicine

## 2024-01-11 ENCOUNTER — Other Ambulatory Visit: Payer: Self-pay | Admitting: Internal Medicine

## 2024-01-11 ENCOUNTER — Inpatient Hospital Stay: Payer: Medicare PPO

## 2024-01-11 ENCOUNTER — Inpatient Hospital Stay: Payer: Medicare PPO | Admitting: Internal Medicine

## 2024-01-11 ENCOUNTER — Inpatient Hospital Stay: Payer: Medicare PPO | Attending: Internal Medicine

## 2024-01-11 VITALS — BP 122/76 | HR 69

## 2024-01-11 DIAGNOSIS — K9589 Other complications of other bariatric procedure: Secondary | ICD-10-CM

## 2024-01-11 DIAGNOSIS — Z79899 Other long term (current) drug therapy: Secondary | ICD-10-CM | POA: Diagnosis not present

## 2024-01-11 DIAGNOSIS — Z853 Personal history of malignant neoplasm of breast: Secondary | ICD-10-CM | POA: Diagnosis not present

## 2024-01-11 DIAGNOSIS — D508 Other iron deficiency anemias: Secondary | ICD-10-CM

## 2024-01-11 DIAGNOSIS — Z9884 Bariatric surgery status: Secondary | ICD-10-CM | POA: Diagnosis not present

## 2024-01-11 DIAGNOSIS — D509 Iron deficiency anemia, unspecified: Secondary | ICD-10-CM | POA: Diagnosis present

## 2024-01-11 DIAGNOSIS — M85851 Other specified disorders of bone density and structure, right thigh: Secondary | ICD-10-CM

## 2024-01-11 LAB — CMP (CANCER CENTER ONLY)
ALT: 26 U/L (ref 0–44)
AST: 32 U/L (ref 15–41)
Albumin: 4.2 g/dL (ref 3.5–5.0)
Alkaline Phosphatase: 65 U/L (ref 38–126)
Anion gap: 10 (ref 5–15)
BUN: 21 mg/dL (ref 8–23)
CO2: 23 mmol/L (ref 22–32)
Calcium: 9.7 mg/dL (ref 8.9–10.3)
Chloride: 96 mmol/L — ABNORMAL LOW (ref 98–111)
Creatinine: 1.06 mg/dL — ABNORMAL HIGH (ref 0.44–1.00)
GFR, Estimated: 55 mL/min — ABNORMAL LOW (ref >60)
Glucose, Bld: 111 mg/dL — ABNORMAL HIGH (ref 70–99)
Potassium: 4.1 mmol/L (ref 3.5–5.1)
Sodium: 129 mmol/L — ABNORMAL LOW (ref 135–145)
Total Bilirubin: 0.7 mg/dL (ref 0.0–1.2)
Total Protein: 7 g/dL (ref 6.5–8.1)

## 2024-01-11 LAB — CBC WITH DIFFERENTIAL (CANCER CENTER ONLY)
Abs Immature Granulocytes: 0.11 10*3/uL — ABNORMAL HIGH (ref 0.00–0.07)
Basophils Absolute: 0.1 10*3/uL (ref 0.0–0.1)
Basophils Relative: 1 %
Eosinophils Absolute: 0.1 10*3/uL (ref 0.0–0.5)
Eosinophils Relative: 2 %
HCT: 36.5 % (ref 36.0–46.0)
Hemoglobin: 12.5 g/dL (ref 12.0–15.0)
Immature Granulocytes: 2 %
Lymphocytes Relative: 16 %
Lymphs Abs: 1.2 10*3/uL (ref 0.7–4.0)
MCH: 30.6 pg (ref 26.0–34.0)
MCHC: 34.2 g/dL (ref 30.0–36.0)
MCV: 89.2 fL (ref 80.0–100.0)
Monocytes Absolute: 0.8 10*3/uL (ref 0.1–1.0)
Monocytes Relative: 10 %
Neutro Abs: 5.2 10*3/uL (ref 1.7–7.7)
Neutrophils Relative %: 69 %
Platelet Count: 326 10*3/uL (ref 150–400)
RBC: 4.09 MIL/uL (ref 3.87–5.11)
RDW: 13.5 % (ref 11.5–15.5)
WBC Count: 7.5 10*3/uL (ref 4.0–10.5)
nRBC: 0 % (ref 0.0–0.2)

## 2024-01-11 LAB — VITAMIN B12: Vitamin B-12: 2324 pg/mL — ABNORMAL HIGH (ref 180–914)

## 2024-01-11 LAB — IRON AND TIBC
Iron: 97 ug/dL (ref 28–170)
Saturation Ratios: 22 % (ref 10.4–31.8)
TIBC: 434 ug/dL (ref 250–450)
UIBC: 337 ug/dL

## 2024-01-11 LAB — FERRITIN: Ferritin: 31 ng/mL (ref 11–307)

## 2024-01-11 MED ORDER — SODIUM CHLORIDE 0.9% FLUSH
10.0000 mL | Freq: Once | INTRAVENOUS | Status: AC | PRN
  Administered 2024-01-11: 10 mL
  Filled 2024-01-11: qty 10

## 2024-01-11 MED ORDER — IRON SUCROSE 20 MG/ML IV SOLN
200.0000 mg | Freq: Once | INTRAVENOUS | Status: AC
  Administered 2024-01-11: 200 mg via INTRAVENOUS

## 2024-01-11 NOTE — Progress Notes (Signed)
 [facility] CONSULT NOTE  Patient Care Team: Vertell Rogue, DO as PCP - General (Family Medicine) Rennie Cindy SAUNDERS, MD as Consulting Physician (Internal Medicine)  CHIEF COMPLAINTS/PURPOSE OF CONSULTATION: Iron  deficiency  Breast cancer- stage I ERpositive; NO chemo [s/p lumpectomy s/p RT- Dr.Corcoran] anastraozle 5 an 1/2years;   BCI testing on 03/02/2016 revealed a 3.6% risk of late recurrence between years 5 through 10.  The likelihood of benefit of extended endocrine therapy is low.   Bone density on 03/15/2016 revealed osteoporosis with a T score of -2.7 in the left hip.  Bone density on 03/14/2018 revealed osteopenia with a T-score of -2.0 in the right femur.  Bone density on 03/17/2020 revealed osteopenia with a T score of -2.1 at the total right femur.  She received Prolia  every 6 months (03/30/2016 - 05/29/2019). She received Zometa  on 11/27/2019.   She has iron  deficiency anemia s/p gastric bypass surgery (07/11/2014).  Ferritin was 6 with an iron  saturation of 9% and a TIBC of 485 on 09/26/2018.  Diet is good.  She denies any bleeding.   HISTORY OF PRESENTING ILLNESS: Alone.  Ambulating independently.  Amanda Downs 73 y.o.  female history of breast cancer stage I ER/PR positive and HER2 negative-currently on surveillance; history of gastric bypass/iron  deficiency; osteopenia on Prolia  [off; last nov 2023] is here for follow-up.  Patient denies any symptoms.  Chronic mild fatigue.  Patient denies any bone pain.  Any nausea vomiting diarrhea fever chills.  Denies any loss of appetite or loss of energy.  Review of Systems  Constitutional:  Negative for chills, diaphoresis, fever, malaise/fatigue and weight loss.  HENT:  Negative for nosebleeds and sore throat.   Eyes:  Negative for double vision.  Respiratory:  Negative for cough, hemoptysis, sputum production, shortness of breath and wheezing.   Cardiovascular:  Negative for chest pain, palpitations,  orthopnea and leg swelling.  Gastrointestinal:  Negative for abdominal pain, blood in stool, constipation, diarrhea, heartburn, melena, nausea and vomiting.  Genitourinary:  Negative for dysuria, frequency and urgency.  Musculoskeletal:  Negative for back pain and joint pain.  Skin: Negative.  Negative for itching and rash.  Neurological:  Negative for dizziness, tingling, focal weakness, weakness and headaches.  Endo/Heme/Allergies:  Does not bruise/bleed easily.  Psychiatric/Behavioral:  Negative for depression. The patient is not nervous/anxious and does not have insomnia.      MEDICAL HISTORY:  Past Medical History:  Diagnosis Date   Anemia    Bariatric surgery status    Cancer (HCC)    BREAST   CHF (congestive heart failure) (HCC)    Chronic systolic (congestive) heart failure (HCC)    Controlled type 2 diabetes mellitus with diabetic nephropathy, without long-term current use of insulin (HCC)    Diabetes mellitus without complication (HCC)    Dilated idiopathic cardiomyopathy (HCC)    GERD (gastroesophageal reflux disease)    Hyperlipemia    Hypertension    Iron  deficiency anemia following bariatric surgery    Mitral valve disorder    Ocular rosacea    Postsurgical malabsorption     SURGICAL HISTORY: Past Surgical History:  Procedure Laterality Date   ABDOMINAL HYSTERECTOMY     BREAST SURGERY Left    CATARACT EXTRACTION W/PHACO Right 11/01/2023   Procedure: PHACOEMULSIFICATION, CATARACT, WITH IOL INSERTION 9.04 01:12.5;  Surgeon: Jaye Fallow, MD;  Location: [facility];  Service: Ophthalmology;  Laterality: Right;   CATARACT EXTRACTION W/PHACO Left 11/15/2023   Procedure: PHACOEMULSIFICATION, CATARACT, WITH IOL INSERTION  7.10 00:46.5;  Surgeon: Jaye Fallow, MD;  Location: [facility];  Service: Ophthalmology;  Laterality: Left;   COLONOSCOPY WITH PROPOFOL  N/A 03/17/2016   Procedure: COLONOSCOPY WITH PROPOFOL ;  Surgeon: Lamar ONEIDA Holmes, MD;   Location: [facility];  Service: Endoscopy;  Laterality: N/A;   HERNIA REPAIR     ROUX-EN-Y GASTRIC BYPASS     TONSILLECTOMY     TUBAL LIGATION     VAGINAL PROLAPSE REPAIR  07/07/2022   at [facility]    SOCIAL HISTORY: Social History   Socioeconomic History   Marital status: Married    Spouse name: Not on file   Number of children: Not on file   Years of education: Not on file   Highest education level: Not on file  Occupational History   Not on file  Tobacco Use   Smoking status: Never    Passive exposure: Yes   Smokeless tobacco: Never  Vaping Use   Vaping status: Never Used  Substance and Sexual Activity   Alcohol use: Yes    Alcohol/week: 1.0 standard drink of alcohol    Types: 1 Glasses of wine per week   Drug use: No   Sexual activity: Not on file  Other Topics Concern   Not on file  Social History Narrative   Not on file   Social Drivers of Health   Financial Resource Strain: Low Risk  (09/18/2023)   Received from [facility]   Overall Financial Resource Strain (CARDIA)    Difficulty of Paying Living Expenses: Not very hard  Food Insecurity: Patient Declined (09/18/2023)   Received from [facility]   Hunger Vital Sign    Within the past 12 months, you worried that your food would run out before you got the money to buy more.: Patient declined    Within the past 12 months, the food you bought just didn't last and you didn't have money to get more.: Patient declined  Transportation Needs: No Transportation Needs (09/18/2023)   Received from [facility] - Transportation    In the past 12 months, has lack of transportation kept you from medical appointments or from getting medications?: No    Lack of Transportation (Non-Medical): No  Physical Activity: Not on file  Stress: Not on file  Social Connections: Not on file  Intimate Partner Violence: Not on file    FAMILY HISTORY: Family History  Problem  Relation Age of Onset   Colon cancer Mother    Stomach cancer Mother     ALLERGIES:  is allergic to lipitor [atorvastatin], rosuvastatin, and simvastatin.  MEDICATIONS:  Current Outpatient Medications  Medication Sig Dispense Refill   Berberine Chloride (BERBERINE HCI PO) Take by mouth at bedtime.     carvedilol  (COREG ) 6.25 MG tablet Take 6.25 mg by mouth 2 (two) times daily.     cyanocobalamin  2000 MCG tablet Take by mouth as needed.      desonide (DESOWEN) 0.05 % cream APPLY TO SCALY AREA RIGHT OF EAR TWO TIMES DAILY FOR 7 DAYS MAX     doxycycline (PERIOSTAT) 20 MG tablet Take 20 mg by mouth 2 (two) times daily.     estradiol (ESTRACE) 0.1 MG/GM vaginal cream Place vaginally.     ezetimibe  (ZETIA ) 10 MG tablet Take 10 mg by mouth daily.      furosemide  (LASIX ) 40 MG tablet Take 40 mg by mouth daily.      ketoconazole (NIZORAL) 2 % cream Apply  1 Application topically daily.     LINZESS 145 MCG CAPS capsule      magnesium oxide (MAG-OX) 400 MG tablet Take 400 mg by mouth daily.      metFORMIN  (GLUCOPHAGE ) 500 MG tablet Take 500 mg by mouth 2 (two) times daily with a meal.     Multiple Vitamin (MULTIVITAMIN) tablet Take 1 tablet by mouth daily.     Omega-3 Fatty Acids (FISH OIL PO) Take by mouth.     omeprazole (PRILOSEC) 20 MG capsule TAKE 1 CAPSULE (20 MG TOTAL) BY MOUTH ONCE DAILY AS NEEDED     ramipril  (ALTACE ) 5 MG capsule Take 5 mg by mouth 2 (two) times daily.      spironolactone  (ALDACTONE ) 25 MG tablet Take 25 mg by mouth daily.      traZODone (DESYREL) 50 MG tablet Take 50 mg by mouth at bedtime as needed for sleep.     triamcinolone  (NASACORT) 55 MCG/ACT AERO nasal inhaler Place 1 spray into the nose daily.      Vitamin D, Cholecalciferol, 400 UNITS CAPS Take 1 capsule by mouth daily.      VITAMIN E PO Take by mouth.     denosumab  (PROLIA ) 60 MG/ML SOSY injection Inject into the skin. (Patient not taking: Reported on 01/11/2024)     No current facility-administered  medications for this visit.      SABRA  PHYSICAL EXAMINATION:  Vitals:   01/11/24 1310  BP: 100/69  Pulse: 71  Temp: 98.2 F (36.8 C)  SpO2: 98%   Filed Weights   01/11/24 1310  Weight: 157 lb 4.8 oz (71.4 kg)     Physical Exam Vitals and nursing note reviewed.  Constitutional:      Comments:     HENT:     Head: Normocephalic and atraumatic.     Mouth/Throat:     Mouth: Mucous membranes are moist.     Pharynx: No oropharyngeal exudate.   Eyes:     Pupils: Pupils are equal, round, and reactive to light.    Cardiovascular:     Rate and Rhythm: Normal rate and regular rhythm.  Pulmonary:     Effort: No respiratory distress.     Breath sounds: No wheezing.     Comments: Decreased breath sounds bilaterally at bases.  No wheeze or crackles Abdominal:     General: Bowel sounds are normal. There is no distension.     Palpations: Abdomen is soft. There is no mass.     Tenderness: There is no abdominal tenderness. There is no guarding or rebound.   Musculoskeletal:        General: No tenderness. Normal range of motion.     Cervical back: Normal range of motion and neck supple.   Skin:    General: Skin is warm.   Neurological:     Mental Status: She is alert and oriented to person, place, and time.   Psychiatric:        Mood and Affect: Affect normal.        Judgment: Judgment normal.      LABORATORY DATA:  I have reviewed the data as listed Lab Results  Component Value Date   WBC 7.5 01/11/2024   HGB 12.5 01/11/2024   HCT 36.5 01/11/2024   MCV 89.2 01/11/2024   PLT 326 01/11/2024   Recent Labs    01/11/23 1421 07/06/23 1252 01/11/24 1306  NA 133* 131* 129*  K 4.6 3.7 4.1  CL 98 97* 96*  CO2 24 23 23   GLUCOSE 105* 100* 111*  BUN 19 21 21   CREATININE 1.11* 0.95 1.06*  CALCIUM 9.4 9.5 9.7  GFRNONAA 53* >60 55*  PROT 6.9 6.5 7.0  ALBUMIN 4.1 3.7 4.2  AST 23 24 32  ALT 19 19 26   ALKPHOS 43 54 65  BILITOT 0.6 0.4 0.7    RADIOGRAPHIC  STUDIES: I have personally reviewed the radiological images as listed and agreed with the findings in the report. DG Bone Density Result Date: 01/05/2024 EXAM: DUAL X-RAY ABSORPTIOMETRY (DXA) FOR BONE MINERAL DENSITY 01/05/2024 10:37 am CLINICAL DATA:  73 year old Female Postmenopausal. Hx Breast Cancer; Screening for osteoporosis History of fragility fracture. Patient is or has been on bone building therapies. TECHNIQUE: An axial (e.g., hips, spine) and/or appendicular (e.g., radius) exam was performed, as appropriate, using GE Secretary/administrator at [facility]. Images are obtained for bone mineral density measurement and are not obtained for diagnostic purposes. [id number] Exclusions: L4. COMPARISON:  05/24/2022. FINDINGS: Scan quality: Good. LUMBAR SPINE (L1-L3): BMD (in g/cm2): 0.987 T-score: -1.5 Z-score: 0.2 Rate of change from previous exam: -15.4 % LEFT FEMORAL NECK: BMD (in g/cm2): 0.721 T-score: -2.3 Z-score: -0.4 LEFT TOTAL HIP: BMD (in g/cm2): 0.737 T-score: -2.1 Z-score: -0.5 RIGHT FEMORAL NECK: BMD (in g/cm2): 0.753 T-score: -2.1 Z-score: -0.2 RIGHT TOTAL HIP: BMD (in g/cm2): 0.752 T-score: -2.0 Z-score: -0.4 DUAL-FEMUR TOTAL MEAN: Rate of change from previous exam: -6.2 % LEFT FOREARM (RADIUS 33%): BMD (in g/cm2): 0.752 T-score: -1.4 Z-score: 0.7 Rate of change from previous exam: 6.7 % FRAX 10-YEAR PROBABILITY OF FRACTURE: FRAX not reported as the patient is receiving bone building therapy. IMPRESSION: Osteopenia based on BMD. Fracture risk is unknown due to history of bone building therapy. RECOMMENDATIONS: 1. All patients should optimize calcium and vitamin D intake. 2. Consider FDA-approved medical therapies in postmenopausal women and men aged 63 years and older, based on the following: - A hip or vertebral (clinical or morphometric) fracture - T-score less than or equal to -2.5 and secondary causes have been excluded. - Low bone mass (T-score between -1.0 and -2.5) and  a 10-year probability of a hip fracture greater than or equal to 3% or a 10-year probability of a major osteoporosis-related fracture greater than or equal to 20% based on the [place] -adapted WHO algorithm. - Clinician judgment and/or patient preferences may indicate treatment for people with 10-year fracture probabilities above or below these levels 3. Patients with diagnosis of osteoporosis or at high risk for fracture should have regular bone mineral density tests. For patients eligible for Medicare, routine testing is allowed once every 2 years. The testing frequency can be increased to one year for patients who have rapidly progressing disease, those who are receiving or discontinuing medical therapy to restore bone mass, or have additional risk factors. Electronically Signed   By: Reyes Phi M.D.   On: 01/05/2024 12:13    Iron  deficiency anemia following bariatric surgery # Iron  def /gastric bypass [2015]-hemoglobin today is Hb 12.2    Constipation sec to PO iron . Pending-  iron  levels today. Proceed with venofer  today. On B12 gummies.   # breast cancer ER- stage I July 2024- mammo-WNL [[facility]]; No evidence of malignancy.- stable. Mammogram-  July 2025- scheduled-   # vaginal atrophy: [Dr.Cindy Edmundsun; [facility]]- on vaginal estrogen- reasonable; no major concerns from breast cancer standpoint.  # Osteoporosis on prolia - BMD- NOV 2023- T score -2.1 [STABLE from 2020]; proceed with Prolia  x 6  years. Last dose  [NOv 2023]. JUNE 2025-cone Osteopenia based on BMD No dental extraction.  Patient on vitamin D. Discussed re: reclast - but BMD not any worse- Will repeat BMD in 1 year [insurance]-    # Hyponatremia- 129- [mild chronic]- diuretics-  stable.   *[facility]amm[facility]-  q 6 m-   # DISPOSITION:  # venofer  today # Follow up in  mid JAN 2026 s- MD; labs- cbc/cmp;Ca27-29;iron  studies,ferritin;B12 levels- possible venofer -  Dr.B  All questions were answered. The patient knows to call the clinic with any  problems, questions or concerns.    Cindy JONELLE Joe, MD 01/11/2024 1:58 PM

## 2024-01-11 NOTE — Assessment & Plan Note (Addendum)
#   Iron  def /gastric bypass [2015]-hemoglobin today is Hb 12.2    Constipation sec to PO iron . Pending-  iron  levels today. Proceed with venofer  today. On B12 gummies.   # breast cancer ER- stage I July 2024- mammo-WNL [[facility]]; No evidence of malignancy.- stable. Mammogram-  July 2025- scheduled-   # vaginal atrophy: [Dr.Cindy Edmundsun; [facility]]- on vaginal estrogen- reasonable; no major concerns from breast cancer standpoint.  # Osteoporosis on prolia - BMD- NOV 2023- T score -2.1 [STABLE from 2020]; proceed with Prolia  x 6 years. Last dose  [NOv 2023]. JUNE 2025-cone Osteopenia based on BMD No dental extraction.  Patient on vitamin D. Discussed re: reclast - but BMD not any worse- Will repeat BMD in 1 year [insurance]-    # Hyponatremia- 129- [mild chronic]- diuretics-  stable.   *[facility]-  q 6 m-   # DISPOSITION:  # venofer  today # Follow up in  mid JAN 2026 s- MD; labs- cbc/cmp;Ca27-29;iron  studies,ferritin;B12 levels- possible venofer -  Dr.B

## 2024-01-11 NOTE — Progress Notes (Signed)
 Fatigue/weakness: NO Dyspena: NO  Light headedness: NO Blood in stool: NO  She would like to discuss starting the prolia . She had bond density done 01/05/24. B/L cataract surgery in April.

## 2024-01-11 NOTE — Addendum Note (Signed)
 Addended by: LAEL BROWNING A on: 01/11/2024 02:04 PM   Modules accepted: Orders

## 2024-01-12 LAB — CANCER ANTIGEN 27.29: CA 27.29: 21.4 U/mL (ref 0.0–38.6)

## 2024-07-24 ENCOUNTER — Inpatient Hospital Stay: Admitting: Internal Medicine

## 2024-07-24 ENCOUNTER — Inpatient Hospital Stay

## 2024-07-24 ENCOUNTER — Encounter: Payer: Self-pay | Admitting: Internal Medicine

## 2024-07-24 ENCOUNTER — Inpatient Hospital Stay: Attending: Internal Medicine

## 2024-07-24 VITALS — BP 136/80 | HR 66

## 2024-07-24 VITALS — BP 129/76 | HR 69 | Temp 97.8°F | Resp 16 | Ht 64.0 in | Wt 156.2 lb

## 2024-07-24 DIAGNOSIS — K9589 Other complications of other bariatric procedure: Secondary | ICD-10-CM | POA: Diagnosis not present

## 2024-07-24 DIAGNOSIS — M85851 Other specified disorders of bone density and structure, right thigh: Secondary | ICD-10-CM

## 2024-07-24 DIAGNOSIS — D508 Other iron deficiency anemias: Secondary | ICD-10-CM

## 2024-07-24 LAB — CMP (CANCER CENTER ONLY)
ALT: 35 U/L (ref 0–44)
AST: 37 U/L (ref 15–41)
Albumin: 4.3 g/dL (ref 3.5–5.0)
Alkaline Phosphatase: 81 U/L (ref 38–126)
Anion gap: 12 (ref 5–15)
BUN: 16 mg/dL (ref 8–23)
CO2: 24 mmol/L (ref 22–32)
Calcium: 9.9 mg/dL (ref 8.9–10.3)
Chloride: 96 mmol/L — ABNORMAL LOW (ref 98–111)
Creatinine: 1.04 mg/dL — ABNORMAL HIGH (ref 0.44–1.00)
GFR, Estimated: 56 mL/min — ABNORMAL LOW
Glucose, Bld: 105 mg/dL — ABNORMAL HIGH (ref 70–99)
Potassium: 4.5 mmol/L (ref 3.5–5.1)
Sodium: 132 mmol/L — ABNORMAL LOW (ref 135–145)
Total Bilirubin: 0.4 mg/dL (ref 0.0–1.2)
Total Protein: 6.7 g/dL (ref 6.5–8.1)

## 2024-07-24 LAB — IRON AND TIBC
Iron: 123 ug/dL (ref 28–170)
Saturation Ratios: 30 % (ref 10.4–31.8)
TIBC: 413 ug/dL (ref 250–450)
UIBC: 290 ug/dL

## 2024-07-24 LAB — CBC WITH DIFFERENTIAL (CANCER CENTER ONLY)
Abs Immature Granulocytes: 0.06 K/uL (ref 0.00–0.07)
Basophils Absolute: 0 K/uL (ref 0.0–0.1)
Basophils Relative: 1 %
Eosinophils Absolute: 0.2 K/uL (ref 0.0–0.5)
Eosinophils Relative: 3 %
HCT: 38.2 % (ref 36.0–46.0)
Hemoglobin: 12.9 g/dL (ref 12.0–15.0)
Immature Granulocytes: 1 %
Lymphocytes Relative: 22 %
Lymphs Abs: 1.3 K/uL (ref 0.7–4.0)
MCH: 30.4 pg (ref 26.0–34.0)
MCHC: 33.8 g/dL (ref 30.0–36.0)
MCV: 89.9 fL (ref 80.0–100.0)
Monocytes Absolute: 0.5 K/uL (ref 0.1–1.0)
Monocytes Relative: 8 %
Neutro Abs: 4 K/uL (ref 1.7–7.7)
Neutrophils Relative %: 65 %
Platelet Count: 288 K/uL (ref 150–400)
RBC: 4.25 MIL/uL (ref 3.87–5.11)
RDW: 14.3 % (ref 11.5–15.5)
WBC Count: 6.1 K/uL (ref 4.0–10.5)
nRBC: 0 % (ref 0.0–0.2)

## 2024-07-24 LAB — FERRITIN: Ferritin: 65 ng/mL (ref 11–307)

## 2024-07-24 LAB — VITAMIN B12: Vitamin B-12: >4000 pg/mL — ABNORMAL HIGH (ref 180–914)

## 2024-07-24 MED ORDER — IRON SUCROSE 20 MG/ML IV SOLN
200.0000 mg | Freq: Once | INTRAVENOUS | Status: AC
  Administered 2024-07-24: 200 mg via INTRAVENOUS
  Filled 2024-07-24: qty 10

## 2024-07-24 NOTE — Progress Notes (Signed)
 [facility] CONSULT NOTE  Patient Care Team: Vertell Rogue, DO as PCP - General (Family Medicine) Rennie Cindy SAUNDERS, MD as Consulting Physician (Oncology)  CHIEF COMPLAINTS/PURPOSE OF CONSULTATION: Iron  deficiency  2011- Breast cancer- stage I ERpositive; NO chemo [s/p lumpectomy s/p RT- Dr.Corcoran] anastraozle 5 an 1/2years;   BCI testing on 03/02/2016 revealed a 3.6% risk of late recurrence between years 5 through 10.  The likelihood of benefit of extended endocrine therapy is low.   Bone density on 03/15/2016 revealed osteoporosis with a T score of -2.7 in the left hip.  Bone density on 03/14/2018 revealed osteopenia with a T-score of -2.0 in the right femur.  Bone density on 03/17/2020 revealed osteopenia with a T score of -2.1 at the total right femur.  She received Prolia  every 6 months (03/30/2016 - 05/29/2019). She received Zometa  on 11/27/2019.   She has iron  deficiency anemia s/p gastric bypass surgery (07/11/2014).  Ferritin was 6 with an iron  saturation of 9% and a TIBC of 485 on 09/26/2018.  Diet is good.  She denies any bleeding.   HISTORY OF PRESENTING ILLNESS: Alone.  Ambulating independently.  Amanda Downs 74 y.o.  female history of breast cancer stage I ER/PR positive and HER2 negative-currently on surveillance; history of gastric bypass/iron  deficiency; osteopenia on Prolia  [off; last nov 2023] is here for follow-up.   Discussed the use of AI scribe software for clinical note transcription with the patient, who gave verbal consent to proceed.  History of Present Illness   Amanda Downs is a 74 year old female with iron  deficiency anemia secondary to gastric bypass and osteoporosis who presents for hematology/oncology follow-up.  She continues to have iron  deficiency anemia attributed to prior gastric bypass surgery and remains on intravenous Venofer  therapy. Hemoglobin has been stable at 12 g/dL. She reports no new symptoms related to  anemia.  Osteoporosis management was reviewed. Osteoporosis management was reviewed. She expressed interest in resuming Prolia  therapy, preferring it over Reclast . Recent laboratory results revealed no contraindications to therapy initiation.  She has a remote diagnosis of breast cancer from 2011, currently in remission. Her most recent mammogram in July 2025 was normal.  She takes daily vitamin B12 supplementation and is aware of previously elevated B12 levels. She is considering reducing the frequency of supplementation to three times per week as discussed.       Review of Systems  Constitutional:  Negative for chills, diaphoresis, fever, malaise/fatigue and weight loss.  HENT:  Negative for nosebleeds and sore throat.   Eyes:  Negative for double vision.  Respiratory:  Negative for cough, hemoptysis, sputum production, shortness of breath and wheezing.   Cardiovascular:  Negative for chest pain, palpitations, orthopnea and leg swelling.  Gastrointestinal:  Negative for abdominal pain, blood in stool, constipation, diarrhea, heartburn, melena, nausea and vomiting.  Genitourinary:  Negative for dysuria, frequency and urgency.  Musculoskeletal:  Negative for back pain and joint pain.  Skin: Negative.  Negative for itching and rash.  Neurological:  Negative for dizziness, tingling, focal weakness, weakness and headaches.  Endo/Heme/Allergies:  Does not bruise/bleed easily.  Psychiatric/Behavioral:  Negative for depression. The patient is not nervous/anxious and does not have insomnia.      MEDICAL HISTORY:  Past Medical History:  Diagnosis Date   Anemia    Bariatric surgery status    Cancer (HCC)    BREAST   CHF (congestive heart failure) (HCC)    Chronic systolic (congestive) heart failure (HCC)    Controlled type  2 diabetes mellitus with diabetic nephropathy, without long-term current use of insulin (HCC)    Diabetes mellitus without complication (HCC)    Dilated idiopathic  cardiomyopathy (HCC)    GERD (gastroesophageal reflux disease)    Hyperlipemia    Hypertension    Iron  deficiency anemia following bariatric surgery    Mitral valve disorder    Ocular rosacea    Postsurgical malabsorption     SURGICAL HISTORY: Past Surgical History:  Procedure Laterality Date   ABDOMINAL HYSTERECTOMY     BREAST SURGERY Left    CATARACT EXTRACTION W/PHACO Right 11/01/2023   Procedure: PHACOEMULSIFICATION, CATARACT, WITH IOL INSERTION 9.04 01:12.5;  Surgeon: Jaye Fallow, MD;  Location: [facility];  Service: Ophthalmology;  Laterality: Right;   CATARACT EXTRACTION W/PHACO Left 11/15/2023   Procedure: PHACOEMULSIFICATION, CATARACT, WITH IOL INSERTION 7.10 00:46.5;  Surgeon: Jaye Fallow, MD;  Location: [facility];  Service: Ophthalmology;  Laterality: Left;   COLONOSCOPY WITH PROPOFOL  N/A 03/17/2016   Procedure: COLONOSCOPY WITH PROPOFOL ;  Surgeon: Lamar ONEIDA Holmes, MD;  Location: [facility];  Service: Endoscopy;  Laterality: N/A;   HERNIA REPAIR     ROUX-EN-Y GASTRIC BYPASS     TONSILLECTOMY     TUBAL LIGATION     VAGINAL PROLAPSE REPAIR  07/07/2022   at [facility]    SOCIAL HISTORY: Social History   Socioeconomic History   Marital status: Married    Spouse name: Not on file   Number of children: Not on file   Years of education: Not on file   Highest education level: Not on file  Occupational History   Not on file  Tobacco Use   Smoking status: Never    Passive exposure: Yes   Smokeless tobacco: Never  Vaping Use   Vaping status: Never Used  Substance and Sexual Activity   Alcohol use: Yes    Alcohol/week: 1.0 standard drink of alcohol    Types: 1 Glasses of wine per week   Drug use: No   Sexual activity: Not on file  Other Topics Concern   Not on file  Social History Narrative   Not on file   Social Drivers of Health   Tobacco Use: Medium Risk (07/24/2024)   Patient History    Smoking Tobacco Use: Never    Smokeless  Tobacco Use: Never    Passive Exposure: Yes  Financial Resource Strain: Low Risk  (09/18/2023)   Received from [facility]   Overall Financial Resource Strain (CARDIA)    Difficulty of Paying Living Expenses: Not very hard  Food Insecurity: Patient Declined (09/18/2023)   Received from [facility]   Epic    Within the past 12 months, you worried that your food would run out before you got the money to buy more.: Patient declined    Within the past 12 months, the food you bought just didn't last and you didn't have money to get more.: Patient declined  Transportation Needs: No Transportation Needs (09/18/2023)   Received from [facility] - Transportation    In the past 12 months, has lack of transportation kept you from medical appointments or from getting medications?: No    Lack of Transportation (Non-Medical): No  Physical Activity: Not on file  Stress: Not on file  Social Connections: Not on file  Intimate Partner Violence: Not on file  Depression (EYV7-0): Not on file  Alcohol Screen: Not on file  Housing: Unknown (09/18/2023)   Received  from [facility]   Epic    In the last 12 months, was there a time when you were not able to pay the mortgage or rent on time?: No    Number of Times Moved in the Last Year: Not on file    At any time in the past 12 months, were you homeless or living in a shelter (including now)?: No  Utilities: Not At Risk (09/18/2023)   Received from [facility] Utilities    Threatened with loss of utilities: No  Health Literacy: Not on file    FAMILY HISTORY: Family History  Problem Relation Age of Onset   Colon cancer Mother    Stomach cancer Mother     ALLERGIES:  is allergic to lipitor [atorvastatin], rosuvastatin, and simvastatin.  MEDICATIONS:  Current Outpatient Medications  Medication Sig Dispense Refill   carvedilol (COREG) 6.25 MG tablet Take  6.25 mg by mouth 2 (two) times daily.     cyanocobalamin  2000 MCG tablet Take by mouth as needed.      desonide (DESOWEN) 0.05 % cream APPLY TO SCALY AREA RIGHT OF EAR TWO TIMES DAILY FOR 7 DAYS MAX     doxycycline (PERIOSTAT) 20 MG tablet Take 20 mg by mouth 2 (two) times daily.     estradiol (ESTRACE) 0.1 MG/GM vaginal cream Place vaginally.     ezetimibe (ZETIA) 10 MG tablet Take 10 mg by mouth daily.      furosemide (LASIX) 40 MG tablet Take 40 mg by mouth daily.      ketoconazole (NIZORAL) 2 % cream Apply 1 Application topically daily.     LINZESS 145 MCG CAPS capsule      magnesium oxide (MAG-OX) 400 MG tablet Take 400 mg by mouth daily.      metFORMIN (GLUCOPHAGE) 500 MG tablet Take 500 mg by mouth 2 (two) times daily with a meal.     Multiple Vitamin (MULTIVITAMIN) tablet Take 1 tablet by mouth daily.     omeprazole (PRILOSEC) 20 MG capsule TAKE 1 CAPSULE (20 MG TOTAL) BY MOUTH ONCE DAILY AS NEEDED     ramipril (ALTACE) 5 MG capsule Take 5 mg by mouth 2 (two) times daily.      spironolactone (ALDACTONE) 25 MG tablet Take 25 mg by mouth daily.      traZODone (DESYREL) 50 MG tablet Take 50 mg by mouth at bedtime as needed for sleep.     triamcinolone (NASACORT) 55 MCG/ACT AERO nasal inhaler Place 1 spray into the nose daily.      Vitamin D, Cholecalciferol, 400 UNITS CAPS Take 1 capsule by mouth daily.      VITAMIN E PO Take by mouth.     Berberine Chloride (BERBERINE HCI PO) Take by mouth at bedtime. (Patient not taking: Reported on 07/24/2024)     denosumab  (PROLIA ) 60 MG/ML SOSY injection Inject into the skin. (Patient not taking: Reported on 01/11/2024)     Omega-3 Fatty Acids (FISH OIL PO) Take by mouth. (Patient not taking: Reported on 07/24/2024)     No current facility-administered medications for this visit.   Facility-Administered Medications Ordered in Other Visits  Medication Dose Route Frequency Provider Last Rate Last Admin   iron  sucrose (VENOFER ) injection 200 mg  200 mg  Intravenous Once Lorrane Mccay R, MD          .  PHYSICAL EXAMINATION:  Vitals:   07/24/24 1343  BP: 129/76  Pulse: 69  Resp: 16  Temp: 97.8 F (36.6 C)  SpO2: 100%   Filed Weights   07/24/24 1343  Weight: 156 lb 3.2 oz (70.9 kg)     Physical Exam Vitals and nursing note reviewed.  Constitutional:      Comments:     HENT:     Head: Normocephalic and atraumatic.     Mouth/Throat:     Mouth: Mucous membranes are moist.     Pharynx: No oropharyngeal exudate.  Eyes:     Pupils: Pupils are equal, round, and reactive to light.  Cardiovascular:     Rate and Rhythm: Normal rate and regular rhythm.  Pulmonary:     Effort: No respiratory distress.     Breath sounds: No wheezing.     Comments: Decreased breath sounds bilaterally at bases.  No wheeze or crackles Abdominal:     General: Bowel sounds are normal. There is no distension.     Palpations: Abdomen is soft. There is no mass.     Tenderness: There is no abdominal tenderness. There is no guarding or rebound.  Musculoskeletal:        General: No tenderness. Normal range of motion.     Cervical back: Normal range of motion and neck supple.  Skin:    General: Skin is warm.  Neurological:     Mental Status: She is alert and oriented to person, place, and time.  Psychiatric:        Mood and Affect: Affect normal.        Judgment: Judgment normal.      LABORATORY DATA:  I have reviewed the data as listed Lab Results  Component Value Date   WBC 6.1 07/24/2024   HGB 12.9 07/24/2024   HCT 38.2 07/24/2024   MCV 89.9 07/24/2024   PLT 288 07/24/2024   Recent Labs    01/11/24 1306 07/24/24 1324  NA 129* 132*  K 4.1 4.5  CL 96* 96*  CO2 23 24  GLUCOSE 111* 105*  BUN 21 16  CREATININE 1.06* 1.04*  CALCIUM 9.7 9.9  GFRNONAA 55* 56*  PROT 7.0 6.7  ALBUMIN 4.2 4.3  AST 32 37  ALT 26 35  ALKPHOS 65 81  BILITOT 0.7 0.4    RADIOGRAPHIC STUDIES: I have personally reviewed the radiological images  as listed and agreed with the findings in the report. No results found.   Iron  deficiency anemia following bariatric surgery # Iron  def /gastric bypass [2015]-hemoglobin today is Hb 12.2   Constipation sec to PO iron . Pending-  iron  levels today. Proceed with venofer  today. On B12 gummies.   # breast cancer ER- stage I July 2024- mammo-WNL [[facility]]; No evidence of malignancy.-Mammogram-  July 2025-WNL.   stable.   # Osteoporosis on prolia - BMD- NOV 2023- T score -2.1 [STABLE from 2020]; proceed with Prolia  x 6 years. Last dose  [NOv 2023]. JUNE 2025-cone Osteopenia based on BMD No dental extraction.  Patient on vitamin D.  Patient interested in resuming Prolia .  Await insurance approval-schedule next week.  # Elevated B12-recommend taking B12 supplementation every other day  # vaginal atrophy: [Dr.Cindy Edmundsun; [facility]]- on vaginal estrogen- reasonable; no major concerns from breast cancer standpoint.  # Hyponatremia- 129- [mild chronic]- diuretics-  stable.   *[facility]-  q 6 m-3m-mammo appt   # DISPOSITION:  # Bil mammo- at [facility] # prolia  this or next week/when approved.  # venofer  today # Follow up in  7  months MD; labs- cbc/cmp;Ca27-29;iron  studies,ferritin;B12 levels- possible venofer ;  prolia  SQ -  Dr.B  All questions were answered. The patient knows to call the clinic with any problems, questions or concerns.    Cindy JONELLE Joe, MD 07/24/2024 2:45 PM

## 2024-07-24 NOTE — Progress Notes (Signed)
 Pt has low energy. Takes Linzess for constipation. Denies any lightheadedness. Denies dyspnea with exertion. No visible blood in stool. Appetite is normal. Patient wants to discuss going back on Prolia .

## 2024-07-24 NOTE — Assessment & Plan Note (Addendum)
#   Iron  def /gastric bypass [2015]-hemoglobin today is Hb 12.2   Constipation sec to PO iron . Pending-  iron  levels today. Proceed with venofer  today. On B12 gummies.   # breast cancer ER- stage I July 2024- mammo-WNL [[facility]]; No evidence of malignancy.-Mammogram-  July 2025-WNL.   stable.   # Osteoporosis on prolia - BMD- NOV 2023- T score -2.1 [STABLE from 2020]; proceed with Prolia  x 6 years. Last dose  [NOv 2023]. JUNE 2025-cone Osteopenia based on BMD No dental extraction.  Patient on vitamin D.  Patient interested in resuming Prolia .  Await insurance approval-schedule next week.  # Elevated B12-recommend taking B12 supplementation every other day  # vaginal atrophy: [Dr.Cindy Edmundsun; [facility]]- on vaginal estrogen- reasonable; no major concerns from breast cancer standpoint.  # Hyponatremia- 129- [mild chronic]- diuretics-  stable.   *[facility]-  q 6 m-44m-mammo appt   # DISPOSITION:  # Bil mammo- at [facility] # prolia  this or next week/when approved.  # venofer  today # Follow up in  7  months MD; labs- cbc/cmp;Ca27-29;iron  studies,ferritin;B12 levels- possible venofer ;  prolia  SQ -  Dr.B

## 2024-07-24 NOTE — Patient Instructions (Signed)

## 2024-07-25 ENCOUNTER — Other Ambulatory Visit: Payer: Self-pay

## 2024-07-25 ENCOUNTER — Ambulatory Visit: Payer: Self-pay | Admitting: Internal Medicine

## 2024-07-25 LAB — CANCER ANTIGEN 27.29: CA 27.29: 19.3 U/mL (ref 0.0–38.6)

## 2024-08-01 ENCOUNTER — Inpatient Hospital Stay

## 2024-08-01 DIAGNOSIS — M85851 Other specified disorders of bone density and structure, right thigh: Secondary | ICD-10-CM

## 2024-08-01 MED ORDER — DENOSUMAB 60 MG/ML ~~LOC~~ SOSY
60.0000 mg | PREFILLED_SYRINGE | Freq: Once | SUBCUTANEOUS | Status: AC
  Administered 2024-08-01: 60 mg via SUBCUTANEOUS
  Filled 2024-08-01: qty 1

## 2025-02-22 ENCOUNTER — Inpatient Hospital Stay

## 2025-02-22 ENCOUNTER — Inpatient Hospital Stay: Admitting: Internal Medicine
